# Patient Record
Sex: Female | Born: 1990 | Race: White | Hispanic: No | Marital: Married | Smoking: Never smoker
Health system: Southern US, Community
[De-identification: ages and names within clinical notes are randomized; demographics above are authoritative.]

## PROBLEM LIST (undated history)

## (undated) DIAGNOSIS — M797 Fibromyalgia: Secondary | ICD-10-CM

## (undated) DIAGNOSIS — T7840XA Allergy, unspecified, initial encounter: Secondary | ICD-10-CM

## (undated) DIAGNOSIS — E538 Deficiency of other specified B group vitamins: Secondary | ICD-10-CM

## (undated) DIAGNOSIS — E079 Disorder of thyroid, unspecified: Secondary | ICD-10-CM

## (undated) HISTORY — DX: Disorder of thyroid, unspecified: E07.9

## (undated) HISTORY — PX: LAPAROSCOPIC GASTRIC SLEEVE RESECTION: SHX5895

## (undated) HISTORY — DX: Allergy, unspecified, initial encounter: T78.40XA

## (undated) HISTORY — PX: OTHER SURGICAL HISTORY: SHX169

## (undated) HISTORY — DX: Deficiency of other specified B group vitamins: E53.8

## (undated) HISTORY — DX: Fibromyalgia: M79.7

---

## 2010-11-09 DIAGNOSIS — R5381 Other malaise: Secondary | ICD-10-CM | POA: Insufficient documentation

## 2013-01-10 DIAGNOSIS — E039 Hypothyroidism, unspecified: Secondary | ICD-10-CM | POA: Insufficient documentation

## 2013-01-10 DIAGNOSIS — E538 Deficiency of other specified B group vitamins: Secondary | ICD-10-CM | POA: Insufficient documentation

## 2013-01-10 DIAGNOSIS — F329 Major depressive disorder, single episode, unspecified: Secondary | ICD-10-CM | POA: Insufficient documentation

## 2013-03-22 DIAGNOSIS — E785 Hyperlipidemia, unspecified: Secondary | ICD-10-CM | POA: Insufficient documentation

## 2013-06-21 DIAGNOSIS — M797 Fibromyalgia: Secondary | ICD-10-CM | POA: Insufficient documentation

## 2015-07-22 DIAGNOSIS — Z9884 Bariatric surgery status: Secondary | ICD-10-CM | POA: Insufficient documentation

## 2016-05-19 DIAGNOSIS — K8020 Calculus of gallbladder without cholecystitis without obstruction: Secondary | ICD-10-CM | POA: Insufficient documentation

## 2016-08-23 DIAGNOSIS — Z862 Personal history of diseases of the blood and blood-forming organs and certain disorders involving the immune mechanism: Secondary | ICD-10-CM | POA: Insufficient documentation

## 2018-01-31 ENCOUNTER — Ambulatory Visit
Admission: RE | Admit: 2018-01-31 | Discharge: 2018-01-31 | Disposition: A | Discharge status: Home / Self Care | Payer: BLUE CROSS/BLUE SHIELD | Source: Ambulatory Visit | Attending: Nurse Practitioner | Admitting: Nurse Practitioner

## 2018-01-31 ENCOUNTER — Other Ambulatory Visit: Payer: Self-pay

## 2018-01-31 ENCOUNTER — Ambulatory Visit: Payer: BLUE CROSS/BLUE SHIELD | Attending: Nurse Practitioner | Admitting: Nurse Practitioner

## 2018-01-31 ENCOUNTER — Encounter: Payer: Self-pay | Admitting: Nurse Practitioner

## 2018-01-31 VITALS — BP 114/70 | HR 80 | Temp 98.1°F | Resp 16 | Ht 65.0 in | Wt 194.0 lb

## 2018-01-31 DIAGNOSIS — M79605 Pain in left leg: Secondary | ICD-10-CM

## 2018-01-31 DIAGNOSIS — M533 Sacrococcygeal disorders, not elsewhere classified: Secondary | ICD-10-CM | POA: Insufficient documentation

## 2018-01-31 DIAGNOSIS — G8929 Other chronic pain: Secondary | ICD-10-CM | POA: Insufficient documentation

## 2018-01-31 DIAGNOSIS — Z8249 Family history of ischemic heart disease and other diseases of the circulatory system: Secondary | ICD-10-CM | POA: Insufficient documentation

## 2018-01-31 DIAGNOSIS — M25551 Pain in right hip: Secondary | ICD-10-CM | POA: Insufficient documentation

## 2018-01-31 DIAGNOSIS — Z833 Family history of diabetes mellitus: Secondary | ICD-10-CM | POA: Diagnosis not present

## 2018-01-31 DIAGNOSIS — Z79899 Other long term (current) drug therapy: Secondary | ICD-10-CM | POA: Insufficient documentation

## 2018-01-31 DIAGNOSIS — F329 Major depressive disorder, single episode, unspecified: Secondary | ICD-10-CM | POA: Insufficient documentation

## 2018-01-31 DIAGNOSIS — G894 Chronic pain syndrome: Secondary | ICD-10-CM | POA: Insufficient documentation

## 2018-01-31 DIAGNOSIS — E039 Hypothyroidism, unspecified: Secondary | ICD-10-CM | POA: Diagnosis not present

## 2018-01-31 DIAGNOSIS — Z79891 Long term (current) use of opiate analgesic: Secondary | ICD-10-CM | POA: Diagnosis not present

## 2018-01-31 DIAGNOSIS — Z886 Allergy status to analgesic agent status: Secondary | ICD-10-CM | POA: Diagnosis not present

## 2018-01-31 DIAGNOSIS — M542 Cervicalgia: Secondary | ICD-10-CM | POA: Diagnosis present

## 2018-01-31 DIAGNOSIS — M79604 Pain in right leg: Secondary | ICD-10-CM | POA: Insufficient documentation

## 2018-01-31 DIAGNOSIS — E538 Deficiency of other specified B group vitamins: Secondary | ICD-10-CM | POA: Insufficient documentation

## 2018-01-31 DIAGNOSIS — M899 Disorder of bone, unspecified: Secondary | ICD-10-CM | POA: Insufficient documentation

## 2018-01-31 DIAGNOSIS — M25552 Pain in left hip: Secondary | ICD-10-CM | POA: Diagnosis not present

## 2018-01-31 DIAGNOSIS — Z881 Allergy status to other antibiotic agents status: Secondary | ICD-10-CM | POA: Insufficient documentation

## 2018-01-31 DIAGNOSIS — Z6832 Body mass index (BMI) 32.0-32.9, adult: Secondary | ICD-10-CM | POA: Diagnosis not present

## 2018-01-31 DIAGNOSIS — Z789 Other specified health status: Secondary | ICD-10-CM | POA: Insufficient documentation

## 2018-01-31 DIAGNOSIS — M2550 Pain in unspecified joint: Secondary | ICD-10-CM | POA: Insufficient documentation

## 2018-01-31 DIAGNOSIS — R109 Unspecified abdominal pain: Secondary | ICD-10-CM | POA: Insufficient documentation

## 2018-01-31 DIAGNOSIS — E785 Hyperlipidemia, unspecified: Secondary | ICD-10-CM | POA: Diagnosis not present

## 2018-01-31 DIAGNOSIS — Z79890 Hormone replacement therapy (postmenopausal): Secondary | ICD-10-CM | POA: Diagnosis not present

## 2018-01-31 NOTE — Progress Notes (Signed)
Safety precautions to be maintained throughout the outpatient stay will include: orient to surroundings, keep bed in low position, maintain call bell within reach at all times, provide assistance with transfer out of bed and ambulation.  

## 2018-01-31 NOTE — Progress Notes (Signed)
Patient's Name: Holly Lopez  MRN: [id number]  Referring Provider: Orlena Sheldon, MD  DOB: [birth year]  PCP: Orlena Sheldon  DOS: 01/31/2018  Note by: Dionisio David NP  Service setting: Ambulatory outpatient  Specialty: Interventional Pain Management  Location: [facility] (AMB) Pain Management Facility    Patient type: New Patient    Primary Reason(s) for Visit: Initial Patient Evaluation CC: Hip Pain (bilateral) and Neck Pain (does not radiate, right worse)  HPI  Ms. Openshaw is a 27 y.o. year old, female patient, who comes today for an initial evaluation. She has Abdominal pain; B12 deficiency; Depression; Primary fibromyalgia syndrome; History of pernicious anemia; Hyperlipidemia; Hypothyroidism; Joint pain; Malaise and fatigue; Morbid obesity ([place]); S/P laparoscopic sleeve gastrectomy; Symptomatic cholelithiasis; Chronic pain of both hips; Chronic sacroiliac joint pain; Chronic pain of both lower extremities; Chronic neck pain; Chronic pain syndrome; Long term current use of opiate analgesic; Pharmacologic therapy; Disorder of skeletal system; and Problems influencing health status on their problem list.. Her primarily concern today is the Hip Pain (bilateral) and Neck Pain (does not radiate, right worse)  Pain Assessment: Location: Right, Left Hip Radiating: radiates Onset: More than a month ago Quality: Aching, Sharp, Discomfort, Spasm(deep) Severity: 6 /10 (subjective, self-reported pain score)  Note: Reported level is compatible with observation. Clinically the patient looks like a 0/10       Information on the proper use of the pain scale provided to the patient today. When using our objective Pain Scale, levels between 6 and 10/10 are said to belong in an emergency room, as it progressively worsens from a 6/10, described as severely limiting, requiring emergency care not usually available at an outpatient pain management facility. At a 6/10 level, communication becomes difficult and requires  great effort. Assistance to reach the emergency department may be required. Facial flushing and profuse sweating along with potentially dangerous increases in heart rate and blood pressure will be evident. 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, cooking, exercising Timing: Constant Modifying factors: heat, strecthing, sitting in the seat of the car BP: 114/70  HR: 80  Onset and Duration: Date of onset: since puberty Cause of pain: Unknown Severity: At its worse:7/10, At its best: 2/10, Most of the time: 4/10 Timing: Night, During activity or exercise and After activity or exercise Aggravating Factors: Lifiting, Motion, Prolonged sitting, Prolonged standing, Walking, Walking uphill and Walking downhill Alleviating Factors: Stretching, Hot packs, Medications and TENS Associated Problems: Depression, Fatigue, Inability to concentrate, Suicidal ideations and Pain that does not allow patient to sleep Quality of Pain: Aching, Deep, Disabling, Exhausting and Sharp Previous Examinations or Tests: X-rays and Chiropractic evaluation Previous Treatments: Chiropractic manipulations, Epidural steroid injections, Physical Therapy and TENS  The patient comes into the clinics today for the first time for a chronic pain management evaluation.  Cording to the patient her primary area of pain is in her "sacroiliac joints" and hips.  Denies any precipitating factors.  She admits that the pain has been gradual over time.  She rates the pain is been equal.  She denies any previous surgery.  She admits that she has had steroid injections to her hips/SI joints.  She admits that it was effective and she finally felt "capable."  She admits that she does do home exercises/go to the gym 3 days/week.  She is unsure if this is helpful because it causes her neck spasms.  Her second area of pain is in her legs.  She  admits that this is rarely to occasionally.  She admits that the pain when  it occurs is in the fronts of her legs goes down into the knees to the top of her ankles.  She admits that the pain affects her whole ankle.  She does keep her ankles wrapped for arch support which seems to help her.   She denies any numbness, tingling or weakness. She admits that walking and standing makes the pain worse.  Her third area of pain is in her neck.  She admits that the right side is greater than the left.  She describes it more as neck spasms.  She denies any previous treatment for her neck other than chiropractor TENS unit heat ice and CBD cream.  She has had x-rays.  Today I took the time to provide the patient with information regarding this pain practice. The patient was informed that the practice is divided into two sections: an interventional pain management section, as well as a completely separate and distinct medication management section. I explained that there are procedure days for interventional therapies, and evaluation days for follow-ups and medication management. Because of the amount of documentation required during both, they are kept separated. This means that there is the possibility that she may be scheduled for a procedure on one day, and medication management the next. I have also informed her that because of staffing and facility limitations, this practice will no longer take patients for medication management only. To illustrate the reasons for this, I gave the patient the example of surgeons, and how inappropriate it would be to refer a patient to his/her care, just to write for the post-surgical antibiotics on a surgery done by a different surgeon.   Because interventional pain management is part of the board-certified specialty for the doctors, the patient was informed that joining this practice means that they are open to any and all interventional therapies. I made it clear that this does not mean that they will be forced to have any procedures done. What this means  is that I believe interventional therapies to be essential part of the diagnosis and proper management of chronic pain conditions. Therefore, patients not interested in these interventional alternatives will be better served under the care of a different practitioner.  The patient was also made aware of my Comprehensive Pain Management Safety Guidelines where by joining this practice, they limit all of their nerve blocks and joint injections to those done by our practice, for as long as we are retained to manage their care. Historic Controlled Substance Pharmacotherapy Review  PMP and historical list of controlled substances: Tramadol 50 mg, oxycodone/acetaminophen 5/325 mg, oxycodone 5 mg Highest opioid analgesic regimen found: Oxycodone 5 mg (questionable dosing) 16 tablets given for 1 day (fill date 07/07/2016)oxycodone 90 mg/day Most recent opioid analgesic: Tramadol 50 mg 1 tablet 3 times daily (last fill date 12/27/2017) tramadol 150 mg/day  Current opioid analgesics:  Tramadol 50 mg 1 tablet 3 times daily (last fill date 12/27/2017) tramadol 150 mg/day Highest recorded MME/day: 120 mg/day MME/day: 15 mg/day Medications: The patient did not bring the medication(s) to the appointment, as requested in our "New Patient Package" Pharmacodynamics: Desired effects: Analgesia: The patient reports >50% benefit. Reported improvement in function: The patient reports medication allows her to accomplish basic ADLs. Clinically meaningful improvement in function (CMIF): Sustained CMIF goals met Perceived effectiveness: Described as relatively effective, allowing for increase in activities of daily living (ADL) Undesirable effects: Side-effects or Adverse reactions:  None reported Historical Monitoring: The patient  has no drug history on file. List of all UDS Test(s): No results found for: MDMA, COCAINSCRNUR, PCPSCRNUR, PCPQUANT, CANNABQUANT, THCU, [place] List of all Serum Drug Screening Test(s):  No  results found for: AMPHSCRSER, BARBSCRSER, BENZOSCRSER, COCAINSCRSER, PCPSCRSER, PCPQUANT, THCSCRSER, CANNABQUANT, OPIATESCRSER, OXYSCRSER, PROPOXSCRSER Historical Background Evaluation: [place] PDMP: Six (6) year initial data search conducted.             [place] Department of public safety, offender search: Editor, commissioning Information) Non-contributory Risk Assessment Profile: Aberrant behavior: None observed or detected today Risk factors for fatal opioid overdose: caucasian Fatal overdose hazard ratio (HR): Calculation deferred Non-fatal overdose hazard ratio (HR): Calculation deferred Risk of opioid abuse or dependence: 0.7-3.0% with doses ? 36 MME/day and 6.1-26% with doses ? 120 MME/day. Substance use disorder (SUD) risk level: Pending results of Medical Psychology Evaluation for SUD Opioid risk tool (ORT) (Total Score):    ORT Scoring interpretation table:  Score <3 = Low Risk for SUD  Score between 4-7 = Moderate Risk for SUD  Score >8 = High Risk for Opioid Abuse   PHQ-2 Depression Scale:  Total score: 3  PHQ-2 Scoring interpretation table: (Score and probability of major depressive disorder)  Score 0 = No depression  Score 1 = 15.4% Probability  Score 2 = 21.1% Probability  Score 3 = 38.4% Probability  Score 4 = 45.5% Probability  Score 5 = 56.4% Probability  Score 6 = 78.6% Probability   PHQ-9 Depression Scale:  Total score: 14  PHQ-9 Scoring interpretation table:  Score 0-4 = No depression  Score 5-9 = Mild depression  Score 10-14 = Moderate depression  Score 15-19 = Moderately severe depression  Score 20-27 = Severe depression (2.4 times higher risk of SUD and 2.89 times higher risk of overuse)   Pharmacologic Plan: Pending ordered tests and/or consults  Meds  The patient has a current medication list which includes the following prescription(s): cholecalciferol, cyanocobalamin, cyclobenzaprine, doxylamine (sleep), folic acid-pyridoxine-cyancobalamin, levonorgestrel-ethinyl  estradiol, levothyroxine, melatonin, multivitamin, tramadol, and UNABLE TO FIND.  Current Outpatient Medications on File Prior to Visit  Medication Sig  . cholecalciferol (VITAMIN D) 1000 units tablet Take 1,000 Units by mouth daily.  . Cyanocobalamin (B-12 COMPLIANCE INJECTION) 1000 MCG/ML KIT Inject as directed.  . cyclobenzaprine (FLEXERIL) 5 MG tablet Take by mouth.  . doxylamine, Sleep, (UNISOM) 25 MG tablet Take 25 mg by mouth at bedtime as needed.  . folic acid-pyridoxine-cyancobalamin (FOLTX) 2.5-25-2 MG TABS tablet Take 1 tablet by mouth daily.  Marland Kitchen levonorgestrel-ethinyl estradiol (ENPRESSE,TRIVORA) tablet Take 1 tablet by mouth daily.  Marland Kitchen levothyroxine (SYNTHROID, LEVOTHROID) 25 MCG tablet levothyroxine 25 mcg tablet  . Melatonin 1 MG CAPS Take by mouth.  . Multiple Vitamin (MULTIVITAMIN) capsule Take 1 capsule by mouth daily.  . traMADol (ULTRAM) 50 MG tablet Take by mouth every 6 (six) hours as needed.  Marland Kitchen UNABLE TO FIND Take 5 g by mouth as needed.   No current facility-administered medications on file prior to visit.    Imaging Review  Note: No new results found.        ROS  Cardiovascular History: No reported cardiovascular signs or symptoms such as High blood pressure, coronary artery disease, abnormal heart rate or rhythm, heart attack, blood thinner therapy or heart weakness and/or failure Pulmonary or Respiratory History: No reported pulmonary signs or symptoms such as wheezing and difficulty taking a deep full breath (Asthma), difficulty blowing air out (Emphysema), coughing up mucus (Bronchitis), persistent dry  cough, or temporary stoppage of breathing during sleep Neurological History: No reported neurological signs or symptoms such as seizures, abnormal skin sensations, urinary and/or fecal incontinence, being born with an abnormal open spine and/or a tethered spinal cord Review of Past Neurological Studies: No results found for this or any previous  visit. Psychological-Psychiatric History: Depressed and Prone to panicking Gastrointestinal History: No reported gastrointestinal signs or symptoms such as vomiting or evacuating blood, reflux, heartburn, alternating episodes of diarrhea and constipation, inflamed or scarred liver, or pancreas or irrregular and/or infrequent bowel movements Genitourinary History: No reported renal or genitourinary signs or symptoms such as difficulty voiding or producing urine, peeing blood, non-functioning kidney, kidney stones, difficulty emptying the bladder, difficulty controlling the flow of urine, or chronic kidney disease Hematological History: Weakness due to low blood hemoglobin or red blood cell count (Anemia) Endocrine History: Slow thyroid Rheumatologic History: Generalized muscle aches (Fibromyalgia) Musculoskeletal History: Negative for myasthenia gravis, muscular dystrophy, multiple sclerosis or malignant hyperthermia Work History: Working part time  Allergies  Ms. Follette is allergic to nsaids and doxycycline.  Laboratory Chemistry  Inflammation Markers No results found for: CRP, ESRSEDRATE (CRP: Acute Phase) (ESR: Chronic Phase) Renal Function Markers No results found for: BUN, CREATININE, GFRAA, GFRNONAA Hepatic Function Markers No results found for: AST, ALT, ALBUMIN, ALKPHOS, HCVAB Electrolytes No results found for: NA, K, CL, CALCIUM, MG Neuropathy Markers No results found for: [id number] Bone Pathology Markers No results found for: Hendricks Milo, VD125OH2TOT, [id number], [id number], 25OHVITD1, 25OHVITD2, 25OHVITD3, CALCIUM, TESTOFREE, TESTOSTERONE Coagulation Parameters No results found for: INR, LABPROT, APTT, PLT Cardiovascular Markers No results found for: BNP, HGB, HCT Note: Lab results reviewed.  PFSH  Drug: Ms. Kugelman  has no drug history on file. Alcohol:  has no alcohol history on file. Tobacco:  has no tobacco history on file. Medical:  has a past medical history of  Allergy, Fibromyalgia, Thyroid disease, and Vitamin B12 deficiency (non anemic). Family: family history includes Diabetes in her brother; Heart disease in her brother; Hypertension in her brother and father; Thyroid disease in her mother.   Active Ambulatory Problems    Diagnosis Date Noted  . Abdominal pain 01/31/2018  . B12 deficiency 01/10/2013  . Depression 01/10/2013  . Primary fibromyalgia syndrome 06/21/2013  . History of pernicious anemia 08/23/2016  . Hyperlipidemia 03/22/2013  . Hypothyroidism 01/10/2013  . Joint pain 01/31/2018  . Malaise and fatigue 11/09/2010  . Morbid obesity ([place]) 01/31/2018  . S/P laparoscopic sleeve gastrectomy 07/22/2015  . Symptomatic cholelithiasis 05/19/2016  . Chronic pain of both hips 01/31/2018  . Chronic sacroiliac joint pain 01/31/2018  . Chronic pain of both lower extremities 01/31/2018  . Chronic neck pain 01/31/2018  . Chronic pain syndrome 01/31/2018  . Long term current use of opiate analgesic 01/31/2018  . Pharmacologic therapy 01/31/2018  . Disorder of skeletal system 01/31/2018  . Problems influencing health status 01/31/2018   Resolved Ambulatory Problems    Diagnosis Date Noted  . No Resolved Ambulatory Problems   Past Medical History:  Diagnosis Date  . Allergy   . Fibromyalgia   . Thyroid disease   . Vitamin B12 deficiency (non anemic)    Constitutional Exam  General appearance: Well nourished, well developed, and well hydrated. In no apparent acute distress Vitals:   01/31/18 0952  BP: 114/70  Pulse: 80  Resp: 16  Temp: 98.1 F (36.7 C)  SpO2: 100%  Weight: 194 lb (88 kg)  Height: 5' 5" (1.651 m)   BMI Assessment: Estimated  body mass index is 32.28 kg/m as calculated from the following:   Height as of this encounter: 5' 5" (1.651 m).   Weight as of this encounter: 194 lb (88 kg).  BMI interpretation table: BMI level Category Range association with higher incidence of chronic pain  <18 kg/m2 Underweight    18.5-24.9 kg/m2 Ideal body weight   25-29.9 kg/m2 Overweight Increased incidence by 20%  30-34.9 kg/m2 Obese (Class I) Increased incidence by 68%  35-39.9 kg/m2 Severe obesity (Class II) Increased incidence by 136%  >40 kg/m2 Extreme obesity (Class III) Increased incidence by 254%   BMI Readings from Last 4 Encounters:  01/31/18 32.28 kg/m   Wt Readings from Last 4 Encounters:  01/31/18 194 lb (88 kg)  Psych/Mental status: Alert, oriented x 3 (person, place, & time)       Eyes: PERLA Respiratory: No evidence of acute respiratory distress  Cervical Spine Exam  Inspection: No masses, redness, or swelling Alignment: Symmetrical Functional ROM: Unrestricted ROM      Stability: No instability detected Muscle strength & Tone: Functionally intact Sensory: Unimpaired Palpation: Complains of area being tender to palpation              Upper Extremity (UE) Exam    Side: Right upper extremity  Side: Left upper extremity  Inspection: No masses, redness, swelling, or asymmetry. No contractures  Inspection: No masses, redness, swelling, or asymmetry. No contractures  Functional ROM: Unrestricted ROM          Functional ROM: Unrestricted ROM          Muscle strength & Tone: Functionally intact  Muscle strength & Tone: Functionally intact  Sensory: Unimpaired  Sensory: Unimpaired  Palpation: No palpable anomalies              Palpation: No palpable anomalies              Specialized Test(s): Deferred         Specialized Test(s): Deferred          Thoracic Spine Exam  Inspection: No masses, redness, or swelling Alignment: Symmetrical Functional ROM: Unrestricted ROM Stability: No instability detected Sensory: Unimpaired Muscle strength & Tone: No palpable anomalies  Lumbar Spine Exam  Inspection: No masses, redness, or swelling Alignment: Symmetrical Functional ROM: Unrestricted ROM      Stability: No instability detected Muscle strength & Tone: Functionally intact Sensory:  Unimpaired Palpation: Complains of area being tender to palpation       Provocative Tests: Lumbar Hyperextension and rotation test: Negative       Patrick's Maneuver: Negative                    Gait & Posture Assessment  Ambulation: Unassisted Gait: Relatively normal for age and body habitus Posture: WNL   Lower Extremity Exam    Side: Right lower extremity  Side: Left lower extremity  Inspection: No masses, redness, swelling, or asymmetry. No contractures ankle and arch wraps  Inspection: No masses, redness, swelling, or asymmetry. No contractures ankle and arch wraps  Functional ROM: Full ROM for all joints of the lower extremity  Functional ROM: Full ROM for all joints of the lower extremity  Muscle strength & Tone: Able to Toe-walk & Heel-walk without problems  Muscle strength & Tone: Able to Toe-walk & Heel-walk without problems  Sensory: Unimpaired  Sensory: Unimpaired  Palpation: No palpable anomalies  Palpation: No palpable anomalies   Assessment  Primary Diagnosis & Pertinent Problem List: The primary  encounter diagnosis was Chronic pain of both hips. Diagnoses of Chronic sacroiliac joint pain, Chronic pain of both lower extremities, Chronic neck pain, Chronic pain syndrome, Long term current use of opiate analgesic, Pharmacologic therapy, Disorder of skeletal system, and Problems influencing health status were also pertinent to this visit.  Visit Diagnosis: 1. Chronic pain of both hips   2. Chronic sacroiliac joint pain   3. Chronic pain of both lower extremities   4. Chronic neck pain   5. Chronic pain syndrome   6. Long term current use of opiate analgesic   7. Pharmacologic therapy   8. Disorder of skeletal system   9. Problems influencing health status    Plan of Care  Initial treatment plan:  Please be advised that as per protocol, today's visit has been an evaluation only. We have not taken over the patient's controlled substance management.  Problem-specific  plan: No problem-specific Assessment & Plan notes found for this encounter.  Ordered Lab-work, Procedure(s), Referral(s), & Consult(s): Orders Placed This Encounter  Procedures  . DG Si Joints  . DG HIP UNILAT W OR W/O PELVIS 2-3 VIEWS LEFT  . DG HIP UNILAT W OR W/O PELVIS 2-3 VIEWS RIGHT  . DG Lumbar Spine Complete W/Bend  . Compliance Drug Analysis, Ur  . Comp. Metabolic Panel (12)  . Magnesium  . Vitamin B12  . Sedimentation rate  . C-reactive protein   Pharmacotherapy: Medications ordered:  No orders of the defined types were placed in this encounter.  Medications administered during this visit: Jolisa Daft had no medications administered during this visit.   Pharmacotherapy under consideration:  Opioid Analgesics: The patient was informed that there is no guarantee that she would be a candidate for opioid analgesics. The decision will be made following CDC guidelines. This decision will be based on the results of diagnostic studies, as well as Ms. Zoll's risk profile.  She is not interested in any opioid medications Membrane stabilizer: To be determined at a later time Muscle relaxant: To be determined at a later time NSAID: To be determined at a later time Other analgesic(s): To be determined at a later time   Interventional therapies under consideration: Ms. Pearlman was informed that there is no guarantee that she would be a candidate for interventional therapies. The decision will be based on the results of diagnostic studies, as well as Ms. Thorman's risk profile.  Possible procedure(s): Diagnostic trigger point injections Diagnostic bilateral sacroiliac joint injections   Provider-requested follow-up: Return for 2nd Visit, w/ Dr. Dossie Arbour.  Future Appointments  Date Time Provider [place]  03/01/2018 10:30 AM Milinda Pointer, MD [facility] None    Primary Care Physician: Orlena Sheldon Location: [facility] Note by:  Date:  01/31/2018; Time: 3:47 PM  Pain Score Disclaimer: We use the NRS-11 scale. This is a self-reported, subjective measurement of pain severity with only modest accuracy. It is used primarily to identify changes within a particular patient. It must be understood that outpatient pain scales are significantly less accurate that those used for research, where they can be applied under ideal controlled circumstances with minimal exposure to variables. In reality, the score is likely to be a combination of pain intensity and pain affect, where pain affect describes the degree of emotional arousal or changes in action readiness caused by the sensory experience of pain. Factors such as social and work situation, setting, emotional state, anxiety levels, expectation, and prior pain experience may influence pain perception and show  large inter-individual differences that may also be affected by time variables.  Patient instructions provided during this appointment: Patient Instructions   ____________________________________________________________________________________________  Appointment Policy Summary  It is our goal and responsibility to provide the medical community with assistance in the evaluation and management of patients with chronic pain. Unfortunately our resources are limited. Because we do not have an unlimited amount of time, or available appointments, we are required to closely monitor and manage their use. The following rules exist to maximize their use:  Patient's responsibilities: 1. Punctuality:  At what time should I arrive? You should be physically present in our office 30 minutes before your scheduled appointment. Your scheduled appointment is with your assigned healthcare provider. However, it takes 5-10 minutes to be "checked-in", and another 15 minutes for the nurses to do the admission. If you arrive to our office at the time you were given for your appointment, you will end up being  at least 20-25 minutes late to your appointment with the provider. 2. Tardiness:  What happens if I arrive only a few minutes after my scheduled appointment time? You will need to reschedule your appointment. The cutoff is your appointment time. This is why it is so important that you arrive at least 30 minutes before that appointment. If you have an appointment scheduled for 10:00 AM and you arrive at 10:01, you will be required to reschedule your appointment.  3. Plan ahead:  Always assume that you will encounter traffic on your way in. Plan for it. If you are dependent on a driver, make sure they understand these rules and the need to arrive early. 4. Other appointments and responsibilities:  Avoid scheduling any other appointments before or after your pain clinic appointments.  5. Be prepared:  Write down everything that you need to discuss with your healthcare provider and give this information to the admitting nurse. Write down the medications that you will need refilled. Bring your pills and bottles (even the empty ones), to all of your appointments, except for those where a procedure is scheduled. 6. No children or pets:  Find someone to take care of them. It is not appropriate to bring them in. 7. Scheduling changes:  We request "advanced notification" of any changes or cancellations. 8. Advanced notification:  Defined as a time period of more than 24 hours prior to the originally scheduled appointment. This allows for the appointment to be offered to other patients. 9. Rescheduling:  When a visit is rescheduled, it will require the cancellation of the original appointment. For this reason they both fall within the category of "Cancellations".  10. Cancellations:  They require advanced notification. Any cancellation less than 24 hours before the  appointment will be recorded as a "No Show". 11. No Show:  Defined as an unkept appointment where the patient failed to notify or declare to  the practice their intention or inability to keep the appointment.  Corrective process for repeat offenders:  1. Tardiness: Three (3) episodes of rescheduling due to late arrivals will be recorded as one (1) "No Show". 2. Cancellation or reschedule: Three (3) cancellations or rescheduling will be recorded as one (1) "No Show". 3. "No Shows": Three (3) "No Shows" within a 12 month period will result in discharge from the practice. ____________________________________________________________________________________________  ____________________________________________________________________________________________  Pain Scale  Introduction: The pain score used by this practice is the Verbal Numerical Rating Scale (VNRS-11). This is an 11-point scale. It is for adults and children 10 years or older.  There are significant differences in how the pain score is reported, used, and applied. Forget everything you learned in the past and learn this scoring system.  General Information: The scale should reflect your current level of pain. Unless you are specifically asked for the level of your worst pain, or your average pain. If you are asked for one of these two, then it should be understood that it is over the past 24 hours.  Basic Activities of Daily Living (ADL): Personal hygiene, dressing, eating, transferring, and using restroom.  Instructions: Most patients tend to report their level of pain as a combination of two factors, their physical pain and their psychosocial pain. This last one is also known as "suffering" and it is reflection of how physical pain affects you socially and psychologically. From now on, report them separately. From this point on, when asked to report your pain level, report only your physical pain. Use the following table for reference.  Pain Clinic Pain Levels (0-5/10)  Pain Level Score  Description  No Pain 0   Mild pain 1 Nagging, annoying, but does not interfere with  basic activities of daily living (ADL). Patients are able to eat, bathe, get dressed, toileting (being able to get on and off the toilet and perform personal hygiene functions), transfer (move in and out of bed or a chair without assistance), and maintain continence (able to control bladder and bowel functions). Blood pressure and heart rate are unaffected. A normal heart rate for a healthy adult ranges from 60 to 100 bpm (beats per minute).   Mild to moderate pain 2 Noticeable and distracting. Impossible to hide from other people. More frequent flare-ups. Still possible to adapt and function close to normal. It can be very annoying and may have occasional stronger flare-ups. With discipline, patients may get used to it and adapt.   Moderate pain 3 Interferes significantly with activities of daily living (ADL). It becomes difficult to feed, bathe, get dressed, get on and off the toilet or to perform personal hygiene functions. Difficult to get in and out of bed or a chair without assistance. Very distracting. With effort, it can be ignored when deeply involved in activities.   Moderately severe pain 4 Impossible to ignore for more than a few minutes. With effort, patients may still be able to manage work or participate in some social activities. Very difficult to concentrate. Signs of autonomic nervous system discharge are evident: dilated pupils (mydriasis); mild sweating (diaphoresis); sleep interference. Heart rate becomes elevated (>115 bpm). Diastolic blood pressure (lower number) rises above 100 mmHg. Patients find relief in laying down and not moving.   Severe pain 5 Intense and extremely unpleasant. Associated with frowning face and frequent crying. Pain overwhelms the senses.  Ability to do any activity or maintain social relationships becomes significantly limited. Conversation becomes difficult. Pacing back and forth is common, as getting into a comfortable position is nearly impossible. Pain  wakes you up from deep sleep. Physical signs will be obvious: pupillary dilation; increased sweating; goosebumps; brisk reflexes; cold, clammy hands and feet; nausea, vomiting or dry heaves; loss of appetite; significant sleep disturbance with inability to fall asleep or to remain asleep. When persistent, significant weight loss is observed due to the complete loss of appetite and sleep deprivation.  Blood pressure and heart rate becomes significantly elevated. Caution: If elevated blood pressure triggers a pounding headache associated with blurred vision, then the patient should immediately seek attention at an urgent or emergency care unit,  as these may be signs of an impending stroke.    Emergency Department Pain Levels (6-10/10)  Emergency Room Pain 6 Severely limiting. Requires emergency care and should not be seen or managed at an outpatient pain management facility. Communication becomes difficult and requires great effort. Assistance to reach the emergency department may be required. Facial flushing and profuse sweating along with potentially dangerous increases in heart rate and blood pressure will be evident.   Distressing pain 7 Self-care is very difficult. Assistance is required to transport, or use restroom. Assistance to reach the emergency department will be required. Tasks requiring coordination, such as bathing and getting dressed become very difficult.   Disabling pain 8 Self-care is no longer possible. At this level, pain is disabling. The individual is unable to do even the most "basic" activities such as walking, eating, bathing, dressing, transferring to a bed, or toileting. Fine motor skills are lost. It is difficult to think clearly.   Incapacitating pain 9 Pain becomes incapacitating. Thought processing is no longer possible. Difficult to remember your own name. Control of movement and coordination are lost.   The worst pain imaginable 10 At this level, most patients pass out  from pain. When this level is reached, collapse of the autonomic nervous system occurs, leading to a sudden drop in blood pressure and heart rate. This in turn results in a temporary and dramatic drop in blood flow to the brain, leading to a loss of consciousness. Fainting is one of the body's self defense mechanisms. Passing out puts the brain in a calmed state and causes it to shut down for a while, in order to begin the healing process.    Summary: 1. Refer to this scale when providing [place] with your pain level. 2. Be accurate and careful when reporting your pain level. This will help with your care. 3. Over-reporting your pain level will lead to loss of credibility. 4. Even a level of 1/10 means that there is pain and will be treated at our facility. 5. High, inaccurate reporting will be documented as "Symptom Exaggeration", leading to loss of credibility and suspicions of possible secondary gains such as obtaining more narcotics, or wanting to appear disabled, for fraudulent reasons. 6. Only pain levels of 5 or below will be seen at our facility. 7. Pain levels of 6 and above will be sent to the Emergency Department and the appointment cancelled. ____________________________________________________________________________________________

## 2018-01-31 NOTE — Patient Instructions (Signed)

## 2018-02-01 LAB — COMP. METABOLIC PANEL (12)
AST: 17 IU/L (ref 0–40)
Albumin/Globulin Ratio: 1.8 (ref 1.2–2.2)
Albumin: 4.4 g/dL (ref 3.5–5.5)
Alkaline Phosphatase: 50 IU/L (ref 39–117)
BUN/Creatinine Ratio: 12 (ref 9–23)
BUN: 11 mg/dL (ref 6–20)
Bilirubin Total: 0.3 mg/dL (ref 0.0–1.2)
CREATININE: 0.95 mg/dL (ref 0.57–1.00)
Calcium: 9.2 mg/dL (ref 8.7–10.2)
Chloride: 104 mmol/L (ref 96–106)
GFR calc Af Amer: 94 mL/min/{1.73_m2} (ref >59)
GFR calc non Af Amer: 81 mL/min/{1.73_m2} (ref >59)
Globulin, Total: 2.5 g/dL (ref 1.5–4.5)
Glucose: 87 mg/dL (ref 65–99)
Potassium: 4.2 mmol/L (ref 3.5–5.2)
Sodium: 141 mmol/L (ref 134–144)
TOTAL PROTEIN: 6.9 g/dL (ref 6.0–8.5)

## 2018-02-01 LAB — VITAMIN B12: Vitamin B-12: 917 pg/mL (ref 232–1245)

## 2018-02-01 LAB — MAGNESIUM: Magnesium: 2.1 mg/dL (ref 1.6–2.3)

## 2018-02-01 LAB — C-REACTIVE PROTEIN: CRP: 9 mg/L (ref 0–10)

## 2018-02-01 LAB — SEDIMENTATION RATE: SED RATE: 6 mm/h (ref 0–32)

## 2018-02-03 LAB — COMPLIANCE DRUG ANALYSIS, UR

## 2018-02-28 DIAGNOSIS — M542 Cervicalgia: Secondary | ICD-10-CM | POA: Insufficient documentation

## 2018-02-28 DIAGNOSIS — M7918 Myalgia, other site: Secondary | ICD-10-CM

## 2018-02-28 DIAGNOSIS — M47812 Spondylosis without myelopathy or radiculopathy, cervical region: Secondary | ICD-10-CM | POA: Insufficient documentation

## 2018-02-28 DIAGNOSIS — M545 Low back pain: Secondary | ICD-10-CM

## 2018-02-28 DIAGNOSIS — M797 Fibromyalgia: Secondary | ICD-10-CM | POA: Insufficient documentation

## 2018-02-28 DIAGNOSIS — G8929 Other chronic pain: Secondary | ICD-10-CM | POA: Insufficient documentation

## 2018-02-28 NOTE — Progress Notes (Signed)
Patient's Name: Holly Lopez  MRN: [id number]  Referring Provider: Orlena Sheldon, MD  DOB: 06-28-1991  PCP: Orlena Sheldon  DOS: 03/01/2018  Note by: Gaspar Cola, MD  Service setting: Ambulatory outpatient  Specialty: Interventional Pain Management  Location: [facility] (AMB) Pain Management Facility    Patient type: Established   Primary Reason(s) for Visit: Encounter for evaluation before starting new chronic pain management plan of care (Level of risk: moderate) CC: Hip Pain  HPI  Holly Lopez is a 27 y.o. year old, female patient, who comes today for a follow-up evaluation to review the test results and decide on a treatment plan. She has Abdominal pain; B12 deficiency; Depression; Primary fibromyalgia syndrome; History of pernicious anemia; Hyperlipidemia; Hypothyroidism; Joint pain; Malaise and fatigue; Morbid obesity ([place]); S/P laparoscopic sleeve gastrectomy; Symptomatic cholelithiasis; Chronic hip pain (Bilateral); Chronic sacroiliac joint pain (Bilateral); Chronic lower extremity pain (Secondary Area of Pain) (Bilateral); Chronic neck pain (Tertiary Area of Pain) (Bilateral) (R>L); Chronic pain syndrome; Long term current use of opiate analgesic; Pharmacologic therapy; Disorder of skeletal system; Problems influencing health status; Fibromyalgia; Chronic musculoskeletal pain; Chronic low back pain (Primary Area of Pain) (Bilateral) w/o sciatica; Cervicalgia; Cervical facet syndrome (Bilateral) (R>L); Lumbar facet joint syndrome (B) (R>L); and Spondylosis without myelopathy or radiculopathy, lumbosacral region on their problem list. Her primarily concern today is the Hip Pain  Pain Assessment: Location: Right, Left Hip(Hip, both wrist and neck) Radiating: Denies Onset: More than a month ago Duration: Chronic pain Quality: Aching, Sharp, Constant, Discomfort Severity: 4 /10 (subjective, self-reported pain score)  Note: Reported level is inconsistent with clinical observations.  Clinically the patient looks like a 2/10 A 2/10 is viewed as "Mild to Moderate" and described as noticeable and distracting. Impossible to hide from other people. More frequent flare-ups. Still possible to adapt and function close to normal. It can be very annoying and may have occasional stronger flare-ups. With discipline, patients may get used to it and adapt. Information on the proper use of the pain scale provided to the patient today. When using our objective Pain Scale, levels between 6 and 10/10 are said to belong in an emergency room, as it progressively worsens from a 6/10, described as severely limiting, requiring emergency care not usually available at an outpatient pain management facility. At a 6/10 level, communication becomes difficult and requires great effort. Assistance to reach the emergency department may be required. Facial flushing and profuse sweating along with potentially dangerous increases in heart rate and blood pressure will be evident. Effect on ADL: prolonged standing, limits daily activities, unable to walk dog,  Timing: Constant Modifying factors: pain medications, heat, strecthing BP: (!) 133/97  HR: 68  Holly Lopez comes in today for a follow-up visit after her initial evaluation on 01/31/2018. Today we went over the results of her tests. These were explained in "Layman's terms". During today's appointment we went over my diagnostic impression, as well as the proposed treatment plan.  According to the patient her primary area of pain is in her "sacroiliac joints" and hips. Denies any precipitating factors. She admits that the pain has been gradual over time. She rates the pain is been equal. She denies any previous surgery. She admits that she has had steroid injections to her hips/SI joints. Used to go to a pain clinic in [place]. She admits that it was effective and she finally felt "capable". She admits that she does do home exercises/go to the gym 3 days/week. PT has not  helped. She is unsure if this is helpful because it causes her neck spasms.  Her second area of pain is in her legs. She admits that this is rarely to occasionally. She admits that the pain when it occurs is in the fronts of her legs goes down into the knees to the top of her ankles. She admits that the pain affects her whole ankle. She does keep her ankles wrapped for arch support which seems to help her. She denies any numbness, tingling or weakness. She admits that walking and standing makes the pain worse.  Her third area of pain is in her neck. She admits that the right side is greater than the left. She describes it more as neck spasms. She denies any previous treatment for her neck other than chiropractor TENS unit heat ice and CBD cream.  She has had x-rays.  In considering the treatment plan options, Ms. Choudhry was reminded that I no longer take patients for medication management only. I asked her to let me know if she had no intention of taking advantage of the interventional therapies, so that we could make arrangements to provide this space to someone interested. I also made it clear that undergoing interventional therapies for the purpose of getting pain medications is very inappropriate on the part of a patient, and it will not be tolerated in this practice. This type of behavior would suggest true addiction and therefore it requires referral to an addiction specialist.   Further details on both, my assessment(s), as well as the proposed treatment plan, please see below.  Controlled Substance Pharmacotherapy Assessment REMS (Risk Evaluation and Mitigation Strategy)  Analgesic: Tramadol 50 mg 1 tablet 3 times daily (last fill date 12/27/2017) tramadol 150 mg/day Highest recorded MME/day: 120 mg/day MME/day: 15 mg/day Pill Count: None expected due to no prior prescriptions written by our practice. No notes on file Pharmacokinetics: Liberation and absorption (onset of action):  WNL Distribution (time to peak effect): WNL Metabolism and excretion (duration of action): WNL         Pharmacodynamics: Desired effects: Analgesia: Ms. Jerome reports >50% benefit. Functional ability: Patient reports that medication allows her to accomplish basic ADLs Clinically meaningful improvement in function (CMIF): Sustained CMIF goals met Perceived effectiveness: Described as relatively effective, allowing for increase in activities of daily living (ADL) Undesirable effects: Side-effects or Adverse reactions: None reported Monitoring: [place] PMP: Online review of the past 12-monthperiod previously conducted. Not applicable at this point since we have not taken over the patient's medication management yet. List of other Serum/Urine Drug Screening Test(s):  No results found for: AMPHSCRSER, BARBSCRSER, BENZOSCRSER, COCAINSCRSER, COCAINSCRNUR, PCPSCRSER, THCSCRSER, THCU, CANNABQUANT, [place] of all UDS test(s) done:  Lab Results  Component Value Date   SUMMARY FINAL 01/31/2018   Last UDS on record: Summary  Date Value Ref Range Status  01/31/2018 FINAL  Final    Comment:    ==================================================================== TOXASSURE COMP DRUG ANALYSIS,UR ==================================================================== Test                             Result       Flag       Units Drug Present and Declared for Prescription Verification   Cyclobenzaprine                PRESENT      EXPECTED   Desmethylcyclobenzaprine       PRESENT  EXPECTED    Desmethylcyclobenzaprine is an expected metabolite of    cyclobenzaprine.   Doxylamine                     PRESENT      EXPECTED Drug Absent but Declared for Prescription Verification   Tramadol                       Not Detected UNEXPECTED ng/mg creat   Duloxetine                     Not Detected  UNEXPECTED ==================================================================== Test                      Result    Flag   Units      Ref Range   Creatinine              176              mg/dL      >=20 ==================================================================== Declared Medications:  The flagging and interpretation on this report are based on the  following declared medications.  Unexpected results may arise from  inaccuracies in the declared medications.  **Note: The testing scope of this panel includes these medications:  Cyclobenzaprine (Flexeril)  Doxylamine (Unisom)  Duloxetine (Cymbalta)  Tramadol (Ultram)  **Note: The testing scope of this panel does not include following  reported medications:  Cyanocobalamin  Drospirenone (Yasmin)  Ethinyl estradiol  Ethinyl Estradiol (Enpresse)  Ethinyl Estradiol (Yasmin)  Levonorgestrel  Levonorgestrel (Enpresse)  Levothyroxine  Melatonin  Multivitamin  Vitamin D ==================================================================== For clinical consultation, please call [phone]. ====================================================================    UDS interpretation: No unexpected findings.          Medication Assessment Form: Patient introduced to form today Treatment compliance: Treatment may start today if patient agrees with proposed plan. Evaluation of compliance is not applicable at this point Risk Assessment Profile: Aberrant behavior: See initial evaluations. None observed or detected today Comorbid factors increasing risk of overdose: See initial evaluation. No additional risks detected today Medical Psychology Evaluation: Please see scanned results in medical record. Opioid risk tool (ORT) (Total Score): 10 Personal History of Substance Abuse (SUD-Substance use disorder):  Alcohol: Negative  Illegal Drugs: Negative  Rx Drugs: Negative  ORT Risk Level calculation: High Risk Risk of substance use  disorder (SUD): Low Opioid Risk Tool - 03/01/18 1021      Family History of Substance Abuse   Alcohol  Positive Female    Illegal Drugs  Positive Female    Rx Drugs  Positive Female or Female      Personal History of Substance Abuse   Alcohol  Negative    Illegal Drugs  Negative    Rx Drugs  Negative      Total Score   Opioid Risk Tool Scoring  10    Opioid Risk Interpretation  High Risk      ORT Scoring interpretation table:  Score <3 = Low Risk for SUD  Score between 4-7 = Moderate Risk for SUD  Score >8 = High Risk for Opioid Abuse   Risk Mitigation Strategies:  Patient opioid safety counseling: Completed today. Counseling provided to patient as per "Patient Counseling Document". Document signed by patient, attesting to counseling and understanding Patient-Prescriber Agreement (PPA): Obtained today.  Controlled substance notification to other providers: Written and sent today.  Pharmacologic Plan: Today we may be taking over the patient's  pharmacological regimen. See below.             Laboratory Chemistry  Inflammation Markers (CRP: Acute Phase) (ESR: Chronic Phase) Lab Results  Component Value Date   CRP 9 01/31/2018   ESRSEDRATE 6 01/31/2018                         Rheumatology Markers No results found.  Renal Function Markers Lab Results  Component Value Date   BUN 11 01/31/2018   CREATININE 0.95 01/31/2018   BCR 12 01/31/2018   GFRAA 94 01/31/2018   GFRNONAA 81 01/31/2018                             Hepatic Function Markers Lab Results  Component Value Date   AST 17 01/31/2018   ALBUMIN 4.4 01/31/2018   ALKPHOS 50 01/31/2018                        Electrolytes Lab Results  Component Value Date   NA 141 01/31/2018   K 4.2 01/31/2018   CL 104 01/31/2018   CALCIUM 9.2 01/31/2018   MG 2.1 01/31/2018                        Neuropathy Markers Lab Results  Component Value Date   VITAMINB12 917 01/31/2018                        Bone Pathology  Markers No results found.  Coagulation Parameters No results found.  Cardiovascular Markers No results found.  CA Markers No results found.  Note: Lab results reviewed.  Recent Diagnostic Imaging Review  Lumbosacral Imaging: Lumbar DG Bending views:  Results for orders placed during the hospital encounter of 01/31/18  DG Lumbar Spine Complete W/Bend   Narrative CLINICAL DATA:  27 year old female with chronic pain both hips and sacroiliac joints. Initial encounter.  EXAM: LUMBAR SPINE - COMPLETE WITH BENDING VIEWS  COMPARISON:  None.  FINDINGS: Normal alignment lumbar spine. No significant disc space narrowing. No abnormal motion between flexion and extension. No compression fracture or pars defect noted. No significant facet degenerative changes. Small bone island right ilium. Sacroiliac joints appear intact. Post surgical changes left upper quadrant.  IMPRESSION: Negative.   Electronically Signed   By: Genia Del M.D.   On: 01/31/2018 12:23    Sacroiliac Joint Imaging: Sacroiliac Joint DG:  Results for orders placed during the hospital encounter of 01/31/18  DG Si Joints   Narrative CLINICAL DATA:  27 year old female with chronic pain both hips and sacroiliac joints. Initial encounter.  EXAM: BILATERAL SACROILIAC JOINTS - 3+ VIEW  COMPARISON:  None.  FINDINGS: Possible small bone island adjacent to the right sacroiliac joint. Sacroiliac joints otherwise intact.  IMPRESSION: Negative.   Electronically Signed   By: Genia Del M.D.   On: 01/31/2018 12:26    Hip Imaging: Hip-R DG 2-3 views:  Results for orders placed during the hospital encounter of 01/31/18  DG HIP UNILAT W OR W/O PELVIS 2-3 VIEWS RIGHT   Narrative CLINICAL DATA:  27 year old female with chronic pain both hips and sacroiliac joints. Initial encounter.  EXAM: DG HIP (WITH OR WITHOUT PELVIS) 2-3V RIGHT  COMPARISON:  None.  FINDINGS: Preservation joint space. No  evidence of femoral head avascular necrosis. No fracture or dislocation.  IMPRESSION: Negative.  Electronically Signed   By: Genia Del M.D.   On: 01/31/2018 12:25    Hip-L DG 2-3 views:  Results for orders placed during the hospital encounter of 01/31/18  DG HIP UNILAT W OR W/O PELVIS 2-3 VIEWS LEFT   Narrative CLINICAL DATA:  27 year old female with chronic pain both hips and sacroiliac joints. Initial encounter.  EXAM: DG HIP (WITH OR WITHOUT PELVIS) 2-3V LEFT  COMPARISON:  None.  FINDINGS: Preservation joint space. No evidence of femoral head avascular necrosis. No fracture or dislocation.  IMPRESSION: Negative.   Electronically Signed   By: Genia Del M.D.   On: 01/31/2018 12:24    Complexity Note: Imaging results reviewed. Results shared with Ms. Palmeri, using Layman's terms.                         Meds   Current Outpatient Medications:  .  cholecalciferol (VITAMIN D) 1000 units tablet, Take 1,000 Units by mouth daily., Disp: , Rfl:  .  Cyanocobalamin (B-12 COMPLIANCE INJECTION) 1000 MCG/ML KIT, Inject as directed., Disp: , Rfl:  .  cyclobenzaprine (FLEXERIL) 5 MG tablet, Take by mouth., Disp: , Rfl:  .  doxylamine, Sleep, (UNISOM) 25 MG tablet, Take 25 mg by mouth at bedtime as needed., Disp: , Rfl:  .  folic acid-pyridoxine-cyancobalamin (FOLTX) 2.5-25-2 MG TABS tablet, Take 1 tablet by mouth daily., Disp: , Rfl:  .  levonorgestrel-ethinyl estradiol (ENPRESSE,TRIVORA) tablet, Take 1 tablet by mouth daily., Disp: , Rfl:  .  levothyroxine (SYNTHROID, LEVOTHROID) 25 MCG tablet, levothyroxine 25 mcg tablet, Disp: , Rfl:  .  Melatonin 1 MG CAPS, Take by mouth., Disp: , Rfl:  .  Multiple Vitamin (MULTIVITAMIN) capsule, Take 1 capsule by mouth daily., Disp: , Rfl:  .  traMADol (ULTRAM) 50 MG tablet, Take by mouth every 6 (six) hours as needed., Disp: , Rfl:  .  UNABLE TO FIND, Take 5 g by mouth as needed., Disp: , Rfl:   ROS  Constitutional: Denies any  fever or chills Gastrointestinal: No reported hemesis, hematochezia, vomiting, or acute GI distress Musculoskeletal: Denies any acute onset joint swelling, redness, loss of ROM, or weakness Neurological: No reported episodes of acute onset apraxia, aphasia, dysarthria, agnosia, amnesia, paralysis, loss of coordination, or loss of consciousness  Allergies  Ms. Omura is allergic to nsaids and doxycycline.  PFSH  Drug: Ms. Apgar  has no drug history on file. Alcohol:  has no alcohol history on file. Tobacco:  reports that she has never smoked. She has never used smokeless tobacco. Medical:  has a past medical history of Allergy, Fibromyalgia, Thyroid disease, and Vitamin B12 deficiency (non anemic). Surgical: Ms. Mofield  has a past surgical history that includes gallbadder and Laparoscopic gastric sleeve resection. Family: family history includes Diabetes in her brother; Heart disease in her brother; Hypertension in her brother and father; Thyroid disease in her mother.  Constitutional Exam  General appearance: Well nourished, well developed, and well hydrated. In no apparent acute distress Vitals:   03/01/18 1003  BP: (!) 133/97  Pulse: 68  Temp: 98.2 F (36.8 C)  SpO2: 100%  Weight: 200 lb (90.7 kg)  Height: 5' 5"  (1.651 m)   BMI Assessment: Estimated body mass index is 33.28 kg/m as calculated from the following:   Height as of this encounter: 5' 5"  (1.651 m).   Weight as of this encounter: 200 lb (90.7 kg).  BMI interpretation table: BMI level Category Range association with higher  incidence of chronic pain  <18 kg/m2 Underweight   18.5-24.9 kg/m2 Ideal body weight   25-29.9 kg/m2 Overweight Increased incidence by 20%  30-34.9 kg/m2 Obese (Class I) Increased incidence by 68%  35-39.9 kg/m2 Severe obesity (Class II) Increased incidence by 136%  >40 kg/m2 Extreme obesity (Class III) Increased incidence by 254%   Patient's current BMI Ideal Body weight  Body mass index is  33.28 kg/m. Ideal body weight: 57 kg (125 lb 10.6 oz) Adjusted ideal body weight: 70.5 kg (155 lb 6.4 oz)   BMI Readings from Last 4 Encounters:  03/07/18 32.95 kg/m  03/01/18 33.28 kg/m  01/31/18 32.28 kg/m   Wt Readings from Last 4 Encounters:  03/07/18 198 lb (89.8 kg)  03/01/18 200 lb (90.7 kg)  01/31/18 194 lb (88 kg)  Psych/Mental status: Alert, oriented x 3 (person, place, & time)       Eyes: PERLA Respiratory: No evidence of acute respiratory distress  Cervical Spine Area Exam  Skin & Axial Inspection: No masses, redness, edema, swelling, or associated skin lesions Alignment: Symmetrical Functional ROM: Unrestricted ROM      Stability: No instability detected Muscle Tone/Strength: Functionally intact. No obvious neuro-muscular anomalies detected. Sensory (Neurological): Unimpaired Palpation: No palpable anomalies              Upper Extremity (UE) Exam    Side: Right upper extremity  Side: Left upper extremity  Skin & Extremity Inspection: Skin color, temperature, and hair growth are WNL. No peripheral edema or cyanosis. No masses, redness, swelling, asymmetry, or associated skin lesions. No contractures.  Skin & Extremity Inspection: Skin color, temperature, and hair growth are WNL. No peripheral edema or cyanosis. No masses, redness, swelling, asymmetry, or associated skin lesions. No contractures.  Functional ROM: Unrestricted ROM          Functional ROM: Unrestricted ROM          Muscle Tone/Strength: Functionally intact. No obvious neuro-muscular anomalies detected.  Muscle Tone/Strength: Functionally intact. No obvious neuro-muscular anomalies detected.  Sensory (Neurological): Unimpaired          Sensory (Neurological): Unimpaired          Palpation: No palpable anomalies              Palpation: No palpable anomalies              Provocative Test(s):  Phalen's test: deferred Tinel's test: deferred Apley's scratch test (touch opposite shoulder):  Action 1 (Across  chest): deferred Action 2 (Overhead): deferred Action 3 (LB reach): deferred   Provocative Test(s):  Phalen's test: deferred Tinel's test: deferred Apley's scratch test (touch opposite shoulder):  Action 1 (Across chest): deferred Action 2 (Overhead): deferred Action 3 (LB reach): deferred    Thoracic Spine Area Exam  Skin & Axial Inspection: No masses, redness, or swelling Alignment: Symmetrical Functional ROM: Unrestricted ROM Stability: No instability detected Muscle Tone/Strength: Functionally intact. No obvious neuro-muscular anomalies detected. Sensory (Neurological): Unimpaired Muscle strength & Tone: No palpable anomalies  Lumbar Spine Area Exam  Skin & Axial Inspection: No masses, redness, or swelling Alignment: Symmetrical Functional ROM: Decreased ROM       Stability: No instability detected Muscle Tone/Strength: Increased muscle tone over affected area Sensory (Neurological): Movement-associated pain Palpation: Complains of area being tender to palpation       Provocative Tests: Hyperextension/rotation test: (+) bilaterally for facet joint pain. Lumbar quadrant test (Kemp's test): (+) bilaterally for facet joint pain. Lateral bending test: deferred today  Patrick's Maneuver: (+) for bilateral hip arthralgia             FABER test: (+) for bilateral hip arthralgia             S-I anterior distraction/compression test: deferred today         S-I lateral compression test: deferred today         S-I Thigh-thrust test: deferred today         S-I Gaenslen's test: deferred today          Gait & Posture Assessment  Ambulation: Limited Gait: Antalgic Posture: Antalgic   Lower Extremity Exam    Side: Right lower extremity  Side: Left lower extremity  Stability: No instability observed          Stability: No instability observed          Skin & Extremity Inspection: Skin color, temperature, and hair growth are WNL. No peripheral edema or cyanosis. No masses,  redness, swelling, asymmetry, or associated skin lesions. No contractures.  Skin & Extremity Inspection: Skin color, temperature, and hair growth are WNL. No peripheral edema or cyanosis. No masses, redness, swelling, asymmetry, or associated skin lesions. No contractures.  Functional ROM: Unrestricted ROM                  Functional ROM: Unrestricted ROM                  Muscle Tone/Strength: Functionally intact. No obvious neuro-muscular anomalies detected.  Muscle Tone/Strength: Functionally intact. No obvious neuro-muscular anomalies detected.  Sensory (Neurological): Unimpaired  Sensory (Neurological): Unimpaired  Palpation: No palpable anomalies  Palpation: No palpable anomalies   Assessment & Plan  Primary Diagnosis & Pertinent Problem List: The primary encounter diagnosis was Chronic low back pain (Primary Area of Pain) (Bilateral) w/o sciatica. Diagnoses of Chronic sacroiliac joint pain (Bilateral), Chronic hip pain (Bilateral), Chronic lower extremity pain (Secondary Area of Pain) (Bilateral), Chronic neck pain (Tertiary Area of Pain) (Bilateral) (R>L), Cervicalgia, Cervical facet syndrome (Bilateral) (R>L), Disorder of skeletal system, Lumbar facet joint syndrome (B) (R>L), and Spondylosis without myelopathy or radiculopathy, lumbosacral region were also pertinent to this visit.  Visit Diagnosis: 1. Chronic low back pain (Primary Area of Pain) (Bilateral) w/o sciatica   2. Chronic sacroiliac joint pain (Bilateral)   3. Chronic hip pain (Bilateral)   4. Chronic lower extremity pain (Secondary Area of Pain) (Bilateral)   5. Chronic neck pain (Tertiary Area of Pain) (Bilateral) (R>L)   6. Cervicalgia   7. Cervical facet syndrome (Bilateral) (R>L)   8. Disorder of skeletal system   9. Lumbar facet joint syndrome (B) (R>L)   10. Spondylosis without myelopathy or radiculopathy, lumbosacral region    Problems updated and reviewed during this visit: No problems updated. Time Note:  Greater than 50% of the 40 minute(s) of face-to-face time spent with Ms. [place]un[place], was spent in counseling/coordination of care regarding: the appropriate use of the pain scale, Ms. Liverman's primary cause of pain, the results of her recent test(s), the significance of each one oth the test(s) anomalies and it's corresponding characteristic pain pattern(s), the treatment plan, treatment alternatives, the risks and possible complications of proposed treatment, realistic expectations and the need to collect and read the AVS material.  Plan of Care  Pharmacotherapy (Medications Ordered): No orders of the defined types were placed in this encounter.   Procedure Orders     LUMBAR FACET(MEDIAL BRANCH NERVE BLOCK) [facility] Lab Orders  No laboratory test(s)  ordered today   Imaging Orders  No imaging studies ordered today    Referral Orders  No referral(s) requested today    Pharmacological management options:  Opioid Analgesics: We'll take over management today. See above orders Membrane stabilizer: We have discussed the possibility of optimizing this mode of therapy, if tolerated Muscle relaxant: We have discussed the possibility of a trial NSAID: We have discussed the possibility of a trial Other analgesic(s): To be determined at a later time   Interventional management options: Planned, scheduled, and/or pending:    Diagnostic bilateral lumbar facet block #1    Considering:   Diagnostic trigger point injections  Diagnostic bilateral sacroiliac joint injections  Possible bilateral sacroiliac joint RFA  Diagnostic bilateral lumbar facet block  Possible bilateral lumbar facet RFA  Diagnostic bilateral intra-articular hip joint injection  Diagnostic bilateral femoral nerve + obturator nerve block  Possible bilateral femoral nerve + obturator nerve RFA  Diagnostic L4-5 interlaminar LESI  Diagnostic right-sided Cervical ESI  Diagnostic bilateral cervical facet block  Possible bilateral cervical  facet RFA    PRN Procedures:   None at this time   Provider-requested follow-up: Return for Procedure (w/ sedation): (B) L-FCT BLK #1.  Future Appointments  Date Time Provider [place]  03/27/2018  9:15 AM Milinda Pointer, MD [facility] None    Primary Care Physician: Orlena Sheldon Location: [facility] Note by: Gaspar Cola, MD Date: 03/01/2018; Time: 11:49 AM

## 2018-03-01 ENCOUNTER — Ambulatory Visit: Payer: BLUE CROSS/BLUE SHIELD | Attending: Pain Medicine | Admitting: Pain Medicine

## 2018-03-01 ENCOUNTER — Other Ambulatory Visit: Payer: Self-pay

## 2018-03-01 ENCOUNTER — Encounter: Payer: Self-pay | Admitting: Pain Medicine

## 2018-03-01 VITALS — BP 133/97 | HR 68 | Temp 98.2°F | Ht 65.0 in | Wt 200.0 lb

## 2018-03-01 DIAGNOSIS — M542 Cervicalgia: Secondary | ICD-10-CM | POA: Diagnosis not present

## 2018-03-01 DIAGNOSIS — M533 Sacrococcygeal disorders, not elsewhere classified: Secondary | ICD-10-CM | POA: Insufficient documentation

## 2018-03-01 DIAGNOSIS — Z833 Family history of diabetes mellitus: Secondary | ICD-10-CM | POA: Insufficient documentation

## 2018-03-01 DIAGNOSIS — R109 Unspecified abdominal pain: Secondary | ICD-10-CM | POA: Insufficient documentation

## 2018-03-01 DIAGNOSIS — M47816 Spondylosis without myelopathy or radiculopathy, lumbar region: Secondary | ICD-10-CM | POA: Diagnosis not present

## 2018-03-01 DIAGNOSIS — M79605 Pain in left leg: Secondary | ICD-10-CM

## 2018-03-01 DIAGNOSIS — M47812 Spondylosis without myelopathy or radiculopathy, cervical region: Secondary | ICD-10-CM

## 2018-03-01 DIAGNOSIS — E039 Hypothyroidism, unspecified: Secondary | ICD-10-CM | POA: Insufficient documentation

## 2018-03-01 DIAGNOSIS — G894 Chronic pain syndrome: Secondary | ICD-10-CM | POA: Insufficient documentation

## 2018-03-01 DIAGNOSIS — Z09 Encounter for follow-up examination after completed treatment for conditions other than malignant neoplasm: Secondary | ICD-10-CM | POA: Insufficient documentation

## 2018-03-01 DIAGNOSIS — M79604 Pain in right leg: Secondary | ICD-10-CM | POA: Insufficient documentation

## 2018-03-01 DIAGNOSIS — M25551 Pain in right hip: Secondary | ICD-10-CM | POA: Diagnosis not present

## 2018-03-01 DIAGNOSIS — M797 Fibromyalgia: Secondary | ICD-10-CM | POA: Diagnosis not present

## 2018-03-01 DIAGNOSIS — F329 Major depressive disorder, single episode, unspecified: Secondary | ICD-10-CM | POA: Diagnosis not present

## 2018-03-01 DIAGNOSIS — R5381 Other malaise: Secondary | ICD-10-CM | POA: Diagnosis not present

## 2018-03-01 DIAGNOSIS — R5383 Other fatigue: Secondary | ICD-10-CM | POA: Insufficient documentation

## 2018-03-01 DIAGNOSIS — K8020 Calculus of gallbladder without cholecystitis without obstruction: Secondary | ICD-10-CM | POA: Diagnosis not present

## 2018-03-01 DIAGNOSIS — M25552 Pain in left hip: Secondary | ICD-10-CM | POA: Insufficient documentation

## 2018-03-01 DIAGNOSIS — D510 Vitamin B12 deficiency anemia due to intrinsic factor deficiency: Secondary | ICD-10-CM | POA: Diagnosis not present

## 2018-03-01 DIAGNOSIS — M545 Low back pain: Secondary | ICD-10-CM

## 2018-03-01 DIAGNOSIS — Z79899 Other long term (current) drug therapy: Secondary | ICD-10-CM | POA: Diagnosis not present

## 2018-03-01 DIAGNOSIS — Z886 Allergy status to analgesic agent status: Secondary | ICD-10-CM | POA: Insufficient documentation

## 2018-03-01 DIAGNOSIS — Z79890 Hormone replacement therapy (postmenopausal): Secondary | ICD-10-CM | POA: Diagnosis not present

## 2018-03-01 DIAGNOSIS — M47817 Spondylosis without myelopathy or radiculopathy, lumbosacral region: Secondary | ICD-10-CM | POA: Diagnosis not present

## 2018-03-01 DIAGNOSIS — Z8249 Family history of ischemic heart disease and other diseases of the circulatory system: Secondary | ICD-10-CM | POA: Insufficient documentation

## 2018-03-01 DIAGNOSIS — Z79891 Long term (current) use of opiate analgesic: Secondary | ICD-10-CM | POA: Insufficient documentation

## 2018-03-01 DIAGNOSIS — Z6833 Body mass index (BMI) 33.0-33.9, adult: Secondary | ICD-10-CM | POA: Insufficient documentation

## 2018-03-01 DIAGNOSIS — Z8379 Family history of other diseases of the digestive system: Secondary | ICD-10-CM | POA: Insufficient documentation

## 2018-03-01 DIAGNOSIS — M899 Disorder of bone, unspecified: Secondary | ICD-10-CM

## 2018-03-01 DIAGNOSIS — G8929 Other chronic pain: Secondary | ICD-10-CM

## 2018-03-01 DIAGNOSIS — E785 Hyperlipidemia, unspecified: Secondary | ICD-10-CM | POA: Insufficient documentation

## 2018-03-01 DIAGNOSIS — Z9889 Other specified postprocedural states: Secondary | ICD-10-CM | POA: Insufficient documentation

## 2018-03-01 DIAGNOSIS — Z881 Allergy status to other antibiotic agents status: Secondary | ICD-10-CM | POA: Insufficient documentation

## 2018-03-01 NOTE — Patient Instructions (Addendum)
____________________________________________________________________________________________  Pain Scale  Introduction: The pain score used by this practice is the Verbal Numerical Rating Scale (VNRS-11). This is an 11-point scale. It is for adults and children 10 years or older. There are significant differences in how the pain score is reported, used, and applied. Forget everything you learned in the past and learn this scoring system.  General Information: The scale should reflect your current level of pain. Unless you are specifically asked for the level of your worst pain, or your average pain. If you are asked for one of these two, then it should be understood that it is over the past 24 hours.  Basic Activities of Daily Living (ADL): Personal hygiene, dressing, eating, transferring, and using restroom.  Instructions: Most patients tend to report their level of pain as a combination of two factors, their physical pain and their psychosocial pain. This last one is also known as "suffering" and it is reflection of how physical pain affects you socially and psychologically. From now on, report them separately. From this point on, when asked to report your pain level, report only your physical pain. Use the following table for reference.  Pain Clinic Pain Levels (0-5/10)  Pain Level Score  Description  No Pain 0   Mild pain 1 Nagging, annoying, but does not interfere with basic activities of daily living (ADL). Patients are able to eat, bathe, get dressed, toileting (being able to get on and off the toilet and perform personal hygiene functions), transfer (move in and out of bed or a chair without assistance), and maintain continence (able to control bladder and bowel functions). Blood pressure and heart rate are unaffected. A normal heart rate for a healthy adult ranges from 60 to 100 bpm (beats per minute).   Mild to moderate pain 2 Noticeable and distracting. Impossible to hide from other  people. More frequent flare-ups. Still possible to adapt and function close to normal. It can be very annoying and may have occasional stronger flare-ups. With discipline, patients may get used to it and adapt.   Moderate pain 3 Interferes significantly with activities of daily living (ADL). It becomes difficult to feed, bathe, get dressed, get on and off the toilet or to perform personal hygiene functions. Difficult to get in and out of bed or a chair without assistance. Very distracting. With effort, it can be ignored when deeply involved in activities.   Moderately severe pain 4 Impossible to ignore for more than a few minutes. With effort, patients may still be able to manage work or participate in some social activities. Very difficult to concentrate. Signs of autonomic nervous system discharge are evident: dilated pupils (mydriasis); mild sweating (diaphoresis); sleep interference. Heart rate becomes elevated (>115 bpm). Diastolic blood pressure (lower number) rises above 100 mmHg. Patients find relief in laying down and not moving.   Severe pain 5 Intense and extremely unpleasant. Associated with frowning face and frequent crying. Pain overwhelms the senses.  Ability to do any activity or maintain social relationships becomes significantly limited. Conversation becomes difficult. Pacing back and forth is common, as getting into a comfortable position is nearly impossible. Pain wakes you up from deep sleep. Physical signs will be obvious: pupillary dilation; increased sweating; goosebumps; brisk reflexes; cold, clammy hands and feet; nausea, vomiting or dry heaves; loss of appetite; significant sleep disturbance with inability to fall asleep or to remain asleep. When persistent, significant weight loss is observed due to the complete loss of appetite and sleep deprivation.  Blood   pressure and heart rate becomes significantly elevated. Caution: If elevated blood pressure triggers a pounding headache  associated with blurred vision, then the patient should immediately seek attention at an urgent or emergency care unit, as these may be signs of an impending stroke.    Emergency Department Pain Levels (6-10/10)  Emergency Room Pain 6 Severely limiting. Requires emergency care and should not be seen or managed at an outpatient pain management facility. Communication becomes difficult and requires great effort. Assistance to reach the emergency department may be required. Facial flushing and profuse sweating along with potentially dangerous increases in heart rate and blood pressure will be evident.   Distressing pain 7 Self-care is very difficult. Assistance is required to transport, or use restroom. Assistance to reach the emergency department will be required. Tasks requiring coordination, such as bathing and getting dressed become very difficult.   Disabling pain 8 Self-care is no longer possible. At this level, pain is disabling. The individual is unable to do even the most "basic" activities such as walking, eating, bathing, dressing, transferring to a bed, or toileting. Fine motor skills are lost. It is difficult to think clearly.   Incapacitating pain 9 Pain becomes incapacitating. Thought processing is no longer possible. Difficult to remember your own name. Control of movement and coordination are lost.   The worst pain imaginable 10 At this level, most patients pass out from pain. When this level is reached, collapse of the autonomic nervous system occurs, leading to a sudden drop in blood pressure and heart rate. This in turn results in a temporary and dramatic drop in blood flow to the brain, leading to a loss of consciousness. Fainting is one of the body's self defense mechanisms. Passing out puts the brain in a calmed state and causes it to shut down for a while, in order to begin the healing process.    Summary: 1. Refer to this scale when providing us with your pain level. 2. Be  accurate and careful when reporting your pain level. This will help with your care. 3. Over-reporting your pain level will lead to loss of credibility. 4. Even a level of 1/10 means that there is pain and will be treated at our facility. 5. High, inaccurate reporting will be documented as "Symptom Exaggeration", leading to loss of credibility and suspicions of possible secondary gains such as obtaining more narcotics, or wanting to appear disabled, for fraudulent reasons. 6. Only pain levels of 5 or below will be seen at our facility. 7. Pain levels of 6 and above will be sent to the Emergency Department and the appointment cancelled. ____________________________________________________________________________________________   ____________________________________________________________________________________________  Preparing for Procedure with Sedation  Instructions: . Oral Intake: Do not eat or drink anything for at least 8 hours prior to your procedure. . Transportation: Public transportation is not allowed. Bring an adult driver. The driver must be physically present in our waiting room before any procedure can be started. . Physical Assistance: Bring an adult physically capable of assisting you, in the event you need help. This adult should keep you company at home for at least 6 hours after the procedure. . Blood Pressure Medicine: Take your blood pressure medicine with a sip of water the morning of the procedure. . Blood thinners: Notify our staff if you are taking any blood thinners. Depending on which one you take, there will be specific instructions on how and when to stop it. . Diabetics on insulin: Notify the staff so that you can be   scheduled 1st case in the morning. If your diabetes requires high dose insulin, take only  of your normal insulin dose the morning of the procedure and notify the staff that you have done so. . Preventing infections: Shower with an antibacterial soap  the morning of your procedure. . Build-up your immune system: Take 1000 mg of Vitamin C with every meal (3 times a day) the day prior to your procedure. Marland Kitchen. Antibiotics: Inform the staff if you have a condition or reason that requires you to take antibiotics before dental procedures. . Pregnancy: If you are pregnant, call and cancel the procedure. . Sickness: If you have a cold, fever, or any active infections, call and cancel the procedure. . Arrival: You must be in the facility at least 30 minutes prior to your scheduled procedure. . Children: Do not bring children with you. . Dress appropriately: Bring dark clothing that you would not mind if they get stained. . Valuables: Do not bring any jewelry or valuables.  Procedure appointments are reserved for interventional treatments only. Marland Kitchen. No Prescription Refills. . No medication changes will be discussed during procedure appointments. . No disability issues will be discussed.  Reasons to call and reschedule or cancel your procedure: (Following these recommendations will minimize the risk of a serious complication.) . Surgeries: Avoid having procedures within 2 weeks of any surgery. (Avoid for 2 weeks before or after any surgery). . Flu Shots: Avoid having procedures within 2 weeks of a flu shots or . (Avoid for 2 weeks before or after immunizations). . Barium: Avoid having a procedure within 7-10 days after having had a radiological study involving the use of radiological contrast. (Myelograms, Barium swallow or enema study). . Heart attacks: Avoid any elective procedures or surgeries for the initial 6 months after a "Myocardial Infarction" (Heart Attack). . Blood thinners: It is imperative that you stop these medications before procedures. Let us know if you if you take any blood thinner.  . Infection: Avoid procedures during or within two weeks of an infection (including chest colds or gastrointestinal problems). Symptoms associated with  infections include: Localized redness, fever, chills, night sweats or profuse sweating, burning sensation when voiding, cough, congestion, stuffiness, runny nose, sore throat, diarrhea, nausea, vomiting, cold or Flu symptoms, recent or current infections. It is specially important if the infection is over the area that we intend to treat. Marland Kitchen. Heart and lung problems: Symptoms that may suggest an active cardiopulmonary problem include: cough, chest pain, breathing difficulties or shortness of breath, dizziness, ankle swelling, uncontrolled high or unusually low blood pressure, and/or palpitations. If you are experiencing any of these symptoms, cancel your procedure and contact your primary care physician for an evaluation.  Remember:  Regular Business hours are:  Monday to Thursday 8:00 AM to 4:00 PM  Provider's Schedule: Delano MetzFrancisco Temekia Caskey, MD:  Procedure days: Tuesday and Thursday 7:30 AM to 4:00 PM  Edward JollyBilal Lateef, MD:  Procedure days: Monday and Wednesday 7:30 AM to 4:00 PM ____________________________________________________________________________________________   Preparing for Procedure with Sedation Instructions: . Oral Intake: Do not eat or drink anything for at least 8 hours prior to your procedure. . Transportation: Public transportation is not allowed. Bring an adult driver. The driver must be physically present in our waiting room before any procedure can be started. Marland Kitchen. Physical Assistance: Bring an adult capable of physically assisting you, in the event you need help. . Blood Pressure Medicine: Take your blood pressure medicine with a sip of water the morning of the  procedure. . Insulin: Take only  of your normal insulin dose. . Preventing infections: Shower with an antibacterial soap the morning of your procedure. . Build-up your immune system: Take 1000 mg of Vitamin C with every meal (3 times a day) the day prior to your procedure. . Pregnancy: If you are pregnant, call and  cancel the procedure. . Sickness: If you have a cold, fever, or any active infections, call and cancel the procedure. . Arrival: You must be in the facility at least 30 minutes prior to your scheduled procedure. . Children: Do not bring children with you. . Dress appropriately: Bring dark clothing that you would not mind if they get stained. . Valuables: Do not bring any jewelry or valuables. Procedure appointments are reserved for interventional treatments only. Marland Kitchen. No Prescription Refills. . No medication changes will be discussed during procedure appointments. . No disability issues will be discussed.

## 2018-03-07 ENCOUNTER — Other Ambulatory Visit: Payer: Self-pay

## 2018-03-07 ENCOUNTER — Ambulatory Visit
Admission: RE | Admit: 2018-03-07 | Discharge: 2018-03-07 | Disposition: A | Discharge status: Home / Self Care | Payer: BLUE CROSS/BLUE SHIELD | Source: Ambulatory Visit | Attending: Pain Medicine | Admitting: Pain Medicine

## 2018-03-07 ENCOUNTER — Ambulatory Visit (HOSPITAL_BASED_OUTPATIENT_CLINIC_OR_DEPARTMENT_OTHER): Payer: BLUE CROSS/BLUE SHIELD | Admitting: Pain Medicine

## 2018-03-07 VITALS — BP 120/81 | HR 60 | Temp 98.6°F | Resp 20 | Ht 65.0 in | Wt 198.0 lb

## 2018-03-07 DIAGNOSIS — G8929 Other chronic pain: Secondary | ICD-10-CM | POA: Insufficient documentation

## 2018-03-07 DIAGNOSIS — M47817 Spondylosis without myelopathy or radiculopathy, lumbosacral region: Secondary | ICD-10-CM

## 2018-03-07 DIAGNOSIS — M545 Low back pain: Secondary | ICD-10-CM

## 2018-03-07 DIAGNOSIS — Z881 Allergy status to other antibiotic agents status: Secondary | ICD-10-CM | POA: Insufficient documentation

## 2018-03-07 DIAGNOSIS — Z886 Allergy status to analgesic agent status: Secondary | ICD-10-CM | POA: Diagnosis not present

## 2018-03-07 DIAGNOSIS — M47816 Spondylosis without myelopathy or radiculopathy, lumbar region: Secondary | ICD-10-CM

## 2018-03-07 MED ORDER — LIDOCAINE HCL 2 % IJ SOLN
20.0000 mL | Freq: Once | INTRAMUSCULAR | Status: AC
  Administered 2018-03-07: 400 mg
  Filled 2018-03-07: qty 40

## 2018-03-07 MED ORDER — ROPIVACAINE HCL 2 MG/ML IJ SOLN
18.0000 mL | Freq: Once | INTRAMUSCULAR | Status: AC
  Administered 2018-03-07: 10 mL via PERINEURAL
  Filled 2018-03-07: qty 20

## 2018-03-07 MED ORDER — TRIAMCINOLONE ACETONIDE 40 MG/ML IJ SUSP
80.0000 mg | Freq: Once | INTRAMUSCULAR | Status: AC
  Administered 2018-03-07: 40 mg
  Filled 2018-03-07: qty 2

## 2018-03-07 MED ORDER — LACTATED RINGERS IV SOLN
1000.0000 mL | Freq: Once | INTRAVENOUS | Status: AC
  Administered 2018-03-07: 1000 mL via INTRAVENOUS

## 2018-03-07 MED ORDER — MIDAZOLAM HCL 5 MG/5ML IJ SOLN
1.0000 mg | INTRAMUSCULAR | Status: DC | PRN
  Administered 2018-03-07: 4 mg via INTRAVENOUS
  Filled 2018-03-07: qty 5

## 2018-03-07 MED ORDER — FENTANYL CITRATE (PF) 100 MCG/2ML IJ SOLN
25.0000 ug | INTRAMUSCULAR | Status: DC | PRN
  Administered 2018-03-07: 100 ug via INTRAVENOUS
  Filled 2018-03-07: qty 2

## 2018-03-07 NOTE — Patient Instructions (Addendum)
____________________________________________________________________________________________  Post-Procedure Discharge Instructions  Instructions:  Apply ice: Fill a plastic sandwich bag with crushed ice. Cover it with a small towel and apply to injection site. Apply for 15 minutes then remove x 15 minutes. Repeat sequence on day of procedure, until you go to bed. The purpose is to minimize swelling and discomfort after procedure.  Apply heat: Apply heat to procedure site starting the day following the procedure. The purpose is to treat any soreness and discomfort from the procedure.  Food intake: Start with clear liquids (like water) and advance to regular food, as tolerated.   Physical activities: Keep activities to a minimum for the first 8 hours after the procedure.   Driving: If you have received any sedation, you are not allowed to drive for 24 hours after your procedure.  Blood thinner: Restart your blood thinner 6 hours after your procedure. (Only for those taking blood thinners)  Insulin: As soon as you can eat, you may resume your normal dosing schedule. (Only for those taking insulin)  Infection prevention: Keep procedure site clean and dry.  Post-procedure Pain Diary: Extremely important that this be done correctly and accurately. Recorded information will be used to determine the next step in treatment.  Pain evaluated is that of treated area only. Do not include pain from an untreated area.  Complete every hour, on the hour, for the initial 8 hours. Set an alarm to help you do this part accurately.  Do not go to sleep and have it completed later. It will not be accurate.  Follow-up appointment: Keep your follow-up appointment after the procedure. Usually 2 weeks for most procedures. (6 weeks in the case of radiofrequency.) Bring you pain diary.   Expect:  From numbing medicine (AKA: Local Anesthetics): Numbness or decrease in pain.  Onset: Full effect within 15  minutes of injected.  Duration: It will depend on the type of local anesthetic used. On the average, 1 to 8 hours.   From steroids: Decrease in swelling or inflammation. Once inflammation is improved, relief of the pain will follow.  Onset of benefits: Depends on the amount of swelling present. The more swelling, the longer it will take for the benefits to be seen. In some cases, up to 10 days.  Duration: Steroids will stay in the system x 2 weeks. Duration of benefits will depend on multiple posibilities including persistent irritating factors.  From procedure: Some discomfort is to be expected once the numbing medicine wears off. This should be minimal if ice and heat are applied as instructed.  Call if:  You experience numbness and weakness that gets worse with time, as opposed to wearing off.  New onset bowel or bladder incontinence. (This applies to Spinal procedures only)  Emergency Numbers:  Durning business hours (Monday - Thursday, 8:00 AM - 4:00 PM) (Friday, 9:00 AM - 12:00 Noon): [phone]  After hours: [phone] ____________________________________________________________________________________________   Pain Management Discharge Instructions  General Discharge Instructions :  If you need to reach your doctor call: Monday-Friday 8:00 am - 4:00 pm at [phone] or toll free [phone].  After clinic hours [phone] to have operator reach doctor.  Bring all of your medication bottles to all your appointments in the pain clinic.  To cancel or reschedule your appointment with Pain Management please remember to call 24 hours in advance to avoid a fee.  Refer to the educational materials which you have been given on: General Risks, I had my Procedure. Discharge Instructions, Post Sedation.    Post Procedure Instructions:  The drugs you were given will stay in your system until tomorrow, so for the next 24 hours you should not drive, make any legal  decisions or drink any alcoholic beverages.  You may eat anything you prefer, but it is better to start with liquids then soups and crackers, and gradually work up to solid foods.  Please notify your doctor immediately if you have any unusual bleeding, trouble breathing or pain that is not related to your normal pain.  Depending on the type of procedure that was done, some parts of your body may feel week and/or numb.  This usually clears up by tonight or the next day.  Walk with the use of an assistive device or accompanied by an adult for the 24 hours.  You may use ice on the affected area for the first 24 hours.  Put ice in a Ziploc bag and cover with a towel and place against area 15 minutes on 15 minutes off.  You may switch to heat after 24 hours.Facet Blocks Patient Information  Description: The facets are joints in the spine between the vertebrae.  Like any joints in the body, facets can become irritated and painful.  Arthritis can also effect the facets.  By injecting steroids and local anesthetic in and around these joints, we can temporarily block the nerve supply to them.  Steroids act directly on irritated nerves and tissues to reduce selling and inflammation which often leads to decreased pain.  Facet blocks may be done anywhere along the spine from the neck to the low back depending upon the location of your pain.   After numbing the skin with local anesthetic (like Novocaine), a small needle is passed onto the facet joints under x-ray guidance.  You may experience a sensation of pressure while this is being done.  The entire block usually lasts about 15-25 minutes.   Conditions which may be treated by facet blocks:   Low back/buttock pain  Neck/shoulder pain  Certain types of headaches  Preparation for the injection:  1. Do not eat any solid food or dairy products within 8 hours of your appointment. 2. You may drink clear liquid up to 3 hours before appointment.  Clear  liquids include water, black coffee, juice or soda.  No milk or cream please. 3. You may take your regular medication, including pain medications, with a sip of water before your appointment.  Diabetics should hold regular insulin (if taken separately) and take 1/2 normal NPH dose the morning of the procedure.  Carry some sugar containing items with you to your appointment. 4. A driver must accompany you and be prepared to drive you home after your procedure. 5. Bring all your current medications with you. 6. An IV may be inserted and sedation may be given at the discretion of the physician. 7. A blood pressure cuff, EKG and other monitors will often be applied during the procedure.  Some patients may need to have extra oxygen administered for a short period. 8. You will be asked to provide medical information, including your allergies and medications, prior to the procedure.  We must know immediately if you are taking blood thinners (like Coumadin/Warfarin) or if you are allergic to IV iodine contrast (dye).  We must know if you could possible be pregnant.  Possible side-effects:   Bleeding from needle site  Infection (rare, may require surgery)  Nerve injury (rare)  Numbness & tingling (temporary)  Difficulty urinating (rare, temporary)    Spinal headache (a headache worse with upright posture)  Light-headedness (temporary)  Pain at injection site (serveral days)  Decreased blood pressure (rare, temporary)  Weakness in arm/leg (temporary)  Pressure sensation in back/neck (temporary)   Call if you experience:   Fever/chills associated with headache or increased back/neck pain  Headache worsened by an upright position  New onset, weakness or numbness of an extremity below the injection site  Hives or difficulty breathing (go to the emergency room)  Inflammation or drainage at the injection site(s)  Severe back/neck pain greater than usual  New symptoms which are  concerning to you  Please note:  Although the local anesthetic injected can often make your back or neck feel good for several hours after the injection, the pain will likely return. It takes 3-7 days for steroids to work.  You may not notice any pain relief for at least one week.  If effective, we will often do a series of 2-3 injections spaced 3-6 weeks apart to maximally decrease your pain.  After the initial series, you may be a candidate for a more permanent nerve block of the facets.  If you have any questions, please call [phone]  [facility] 

## 2018-03-07 NOTE — Progress Notes (Signed)
Patient's Name: Holly Lopez  MRN: [id number]  Referring Provider: Darvin Neighboursornelio, Oscar A, MD  DOB: October 27, 1990  PCP: Darvin Neighboursornelio, Oscar A  DOS: 03/07/2018  Note by: Oswaldo DoneFrancisco A Leodan Bolyard, MD  Service setting: Ambulatory outpatient  Specialty: Interventional Pain Management  Patient type: Established  Location: [facility] (AMB) Pain Management Facility  Visit type: Interventional Procedure   Primary Reason for Visit: Interventional Pain Management Treatment. CC: Back Pain (lower)  Procedure:          Anesthesia, Analgesia, Anxiolysis:  Type: Lumbar Facet, Medial Branch Block(s) #1  Primary Purpose: Diagnostic Region: Posterolateral Lumbosacral Spine Level: L2, L3, L4, L5, & S1 Medial Branch Level(s). Injecting these levels blocks the L3-4, L4-5, and L5-S1 lumbar facet joints. Laterality: Bilateral  Type: Moderate (Conscious) Sedation combined with Local Anesthesia Indication(s): Analgesia and Anxiety Route: Intravenous (IV) IV Access: Secured Sedation: Meaningful verbal contact was maintained at all times during the procedure  Local Anesthetic: Lidocaine 1-2%   Indications: 1. Spondylosis without myelopathy or radiculopathy, lumbosacral region   2. Lumbar facet joint syndrome (B) (R>L)   3. Chronic low back pain (Primary Area of Pain) (Bilateral) w/o sciatica    Pain Score: Pre-procedure: 5 /10 Post-procedure: 0-No pain/10  Pre-op Assessment:  Holly Lopez is a 27 y.o. (year old), female patient, seen today for interventional treatment. She  has a past surgical history that includes gallbadder and Laparoscopic gastric sleeve resection. Holly Lopez has a current medication list which includes the following prescription(s): cholecalciferol, cyanocobalamin, cyclobenzaprine, doxylamine (sleep), folic acid-pyridoxine-cyancobalamin, levonorgestrel-ethinyl estradiol, levothyroxine, melatonin, multivitamin, tramadol, and UNABLE TO FIND, and the following Facility-Administered Medications: fentanyl, lactated ringers,  and midazolam. Her primarily concern today is the Back Pain (lower)  Initial Vital Signs:  Pulse/HCG Rate: 60ECG Heart Rate: 80 Temp: 98.3 F (36.8 C) Resp: 16 BP: (!) 136/91 SpO2: 100 %  BMI: Estimated body mass index is 32.95 kg/m as calculated from the following:   Height as of this encounter: 5\' 5"  (1.651 m).   Weight as of this encounter: 198 lb (89.8 kg).  Risk Assessment: Allergies: Reviewed. She is allergic to nsaids and doxycycline.  Allergy Precautions: None required Coagulopathies: Reviewed. None identified.  Blood-thinner therapy: None at this time Active Infection(s): Reviewed. None identified. Holly Lopez is afebrile  Site Confirmation: Holly Lopez was asked to confirm the procedure and laterality before marking the site Procedure checklist: Completed Consent: Before the procedure and under the influence of no sedative(s), amnesic(s), or anxiolytics, the patient was informed of the treatment options, risks and possible complications. To fulfill our ethical and legal obligations, as recommended by the American Medical Association's Code of Ethics, I have informed the patient of my clinical impression; the nature and purpose of the treatment or procedure; the risks, benefits, and possible complications of the intervention; the alternatives, including doing nothing; the risk(s) and benefit(s) of the alternative treatment(s) or procedure(s); and the risk(s) and benefit(s) of doing nothing. The patient was provided information about the general risks and possible complications associated with the procedure. These may include, but are not limited to: failure to achieve desired goals, infection, bleeding, organ or nerve damage, allergic reactions, paralysis, and death. In addition, the patient was informed of those risks and complications associated to Spine-related procedures, such as failure to decrease pain; infection (i.e.: Meningitis, epidural or intraspinal abscess); bleeding  (i.e.: epidural hematoma, subarachnoid hemorrhage, or any other type of intraspinal or peri-dural bleeding); organ or nerve damage (i.e.: Any type of peripheral nerve, nerve root, or spinal cord injury)  with subsequent damage to sensory, motor, and/or autonomic systems, resulting in permanent pain, numbness, and/or weakness of one or several areas of the body; allergic reactions; (i.e.: anaphylactic reaction); and/or death. Furthermore, the patient was informed of those risks and complications associated with the medications. These include, but are not limited to: allergic reactions (i.e.: anaphylactic or anaphylactoid reaction(s)); adrenal axis suppression; blood sugar elevation that in diabetics may result in ketoacidosis or comma; water retention that in patients with history of congestive heart failure may result in shortness of breath, pulmonary edema, and decompensation with resultant heart failure; weight gain; swelling or edema; medication-induced neural toxicity; particulate matter embolism and blood vessel occlusion with resultant organ, and/or nervous system infarction; and/or aseptic necrosis of one or more joints. Finally, the patient was informed that Medicine is not an exact science; therefore, there is also the possibility of unforeseen or unpredictable risks and/or possible complications that may result in a catastrophic outcome. The patient indicated having understood very clearly. We have given the patient no guarantees and we have made no promises. Enough time was given to the patient to ask questions, all of which were answered to the patient's satisfaction. Ms. Kelty has indicated that she wanted to continue with the procedure. Attestation: I, the ordering provider, attest that I have discussed with the patient the benefits, risks, side-effects, alternatives, likelihood of achieving goals, and potential problems during recovery for the procedure that I have provided informed consent. Date   Time: 03/07/2018  1:19 PM  Pre-Procedure Preparation:  Monitoring: As per clinic protocol. Respiration, ETCO2, SpO2, BP, heart rate and rhythm monitor placed and checked for adequate function Safety Precautions: Patient was assessed for positional comfort and pressure points before starting the procedure. Time-out: I initiated and conducted the "Time-out" before starting the procedure, as per protocol. The patient was asked to participate by confirming the accuracy of the "Time Out" information. Verification of the correct person, site, and procedure were performed and confirmed by me, the nursing staff, and the patient. "Time-out" conducted as per Joint Commission's Universal Protocol (UP.01.01.01). Time: 1348  Description of Procedure:          Position: Prone Laterality: Bilateral. The procedure was performed in identical fashion on both sides. Levels:  L2, L3, L4, L5, & S1 Medial Branch Level(s) Area Prepped: Posterior Lumbosacral Region Prepping solution: ChloraPrep (2% chlorhexidine gluconate and 70% isopropyl alcohol) Safety Precautions: Aspiration looking for blood return was conducted prior to all injections. At no point did we inject any substances, as a needle was being advanced. Before injecting, the patient was told to immediately notify me if she was experiencing any new onset of "ringing in the ears, or metallic taste in the mouth". No attempts were made at seeking any paresthesias. Safe injection practices and needle disposal techniques used. Medications properly checked for expiration dates. SDV (single dose vial) medications used. After the completion of the procedure, all disposable equipment used was discarded in the proper designated medical waste containers. Local Anesthesia: Protocol guidelines were followed. The patient was positioned over the fluoroscopy table. The area was prepped in the usual manner. The time-out was completed. The target area was identified using  fluoroscopy. A 12-in long, straight, sterile hemostat was used with fluoroscopic guidance to locate the targets for each level blocked. Once located, the skin was marked with an approved surgical skin marker. Once all sites were marked, the skin (epidermis, dermis, and hypodermis), as well as deeper tissues (fat, connective tissue and muscle) were infiltrated with  a small amount of a short-acting local anesthetic, loaded on a 10cc syringe with a 25G, 1.5-in  Needle. An appropriate amount of time was allowed for local anesthetics to take effect before proceeding to the next step. Local Anesthetic: Lidocaine 2.0% The unused portion of the local anesthetic was discarded in the proper designated containers. Technical explanation of process:  L2 Medial Branch Nerve Block (MBB): The target area for the L2 medial branch is at the junction of the postero-lateral aspect of the superior articular process and the superior, posterior, and medial edge of the transverse process of L3. Under fluoroscopic guidance, a Quincke needle was inserted until contact was made with os over the superior postero-lateral aspect of the pedicular shadow (target area). After negative aspiration for blood, 0.5 mL of the nerve block solution was injected without difficulty or complication. The needle was removed intact. L3 Medial Branch Nerve Block (MBB): The target area for the L3 medial branch is at the junction of the postero-lateral aspect of the superior articular process and the superior, posterior, and medial edge of the transverse process of L4. Under fluoroscopic guidance, a Quincke needle was inserted until contact was made with os over the superior postero-lateral aspect of the pedicular shadow (target area). After negative aspiration for blood, 0.5 mL of the nerve block solution was injected without difficulty or complication. The needle was removed intact. L4 Medial Branch Nerve Block (MBB): The target area for the L4 medial branch  is at the junction of the postero-lateral aspect of the superior articular process and the superior, posterior, and medial edge of the transverse process of L5. Under fluoroscopic guidance, a Quincke needle was inserted until contact was made with os over the superior postero-lateral aspect of the pedicular shadow (target area). After negative aspiration for blood, 0.5 mL of the nerve block solution was injected without difficulty or complication. The needle was removed intact. L5 Medial Branch Nerve Block (MBB): The target area for the L5 medial branch is at the junction of the postero-lateral aspect of the superior articular process and the superior, posterior, and medial edge of the sacral ala. Under fluoroscopic guidance, a Quincke needle was inserted until contact was made with os over the superior postero-lateral aspect of the pedicular shadow (target area). After negative aspiration for blood, 0.5 mL of the nerve block solution was injected without difficulty or complication. The needle was removed intact. S1 Medial Branch Nerve Block (MBB): The target area for the S1 medial branch is at the posterior and inferior 6 o'clock position of the L5-S1 facet joint. Under fluoroscopic guidance, the Quincke needle inserted for the L5 MBB was redirected until contact was made with os over the inferior and postero aspect of the sacrum, at the 6 o' clock position under the L5-S1 facet joint (Target area). After negative aspiration for blood, 0.5 mL of the nerve block solution was injected without difficulty or complication. The needle was removed intact. Procedural Needles: 22-gauge, 3.5-inch, Quincke needles used for all levels. Nerve block solution: 0.2% PF-Ropivacaine + Triamcinolone (40 mg/mL) diluted to a final concentration of 4 mg of Triamcinolone/mL of Ropivacaine The unused portion of the solution was discarded in the proper designated containers.  Once the entire procedure was completed, the treated area  was cleaned, making sure to leave some of the prepping solution back to take advantage of its long term bactericidal properties.   Illustration of the posterior view of the lumbar spine and the posterior neural structures. Laminae of L2 through  S1 are labeled. DPRL5, dorsal primary ramus of L5; DPRS1, dorsal primary ramus of S1; DPR3, dorsal primary ramus of L3; FJ, facet (zygapophyseal) joint L3-L4; I, inferior articular process of L4; LB1, lateral branch of dorsal primary ramus of L1; IAB, inferior articular branches from L3 medial branch (supplies L4-L5 facet joint); IBP, intermediate branch plexus; MB3, medial branch of dorsal primary ramus of L3; NR3, third lumbar nerve root; S, superior articular process of L5; SAB, superior articular branches from L4 (supplies L4-5 facet joint also); TP3, transverse process of L3.  Vitals:   03/07/18 1355 03/07/18 1401 03/07/18 1407 03/07/18 1415  BP: (!) 117/100 (!) 117/100 132/83 130/88  Pulse:      Resp: (!) 8 11 10 20   Temp:      TempSrc:      SpO2: 100% 100% 97% 100%  Weight:      Height:        Start Time: 1348 hrs. End Time: 1404 hrs.  Imaging Guidance (Spinal):          Type of Imaging Technique: Fluoroscopy Guidance (Spinal) Indication(s): Assistance in needle guidance and placement for procedures requiring needle placement in or near specific anatomical locations not easily accessible without such assistance. Exposure Time: Please see nurses notes. Contrast: None used. Fluoroscopic Guidance: I was personally present during the use of fluoroscopy. "Tunnel Vision Technique" used to obtain the best possible view of the target area. Parallax error corrected before commencing the procedure. "Direction-depth-direction" technique used to introduce the needle under continuous pulsed fluoroscopy. Once target was reached, antero-posterior, oblique, and lateral fluoroscopic projection used confirm needle placement in all planes. Images permanently  stored in EMR. Interpretation: No contrast injected. I personally interpreted the imaging intraoperatively. Adequate needle placement confirmed in multiple planes. Permanent images saved into the patient's record.  Antibiotic Prophylaxis:   Anti-infectives (From admission, onward)   None     Indication(s): None identified  Post-operative Assessment:  Post-procedure Vital Signs:  Pulse/HCG Rate: 60(!) 58 Temp: 98.3 F (36.8 C) Resp: 20 BP: 130/88 SpO2: 100 %  EBL: None  Complications: No immediate post-treatment complications observed by team, or reported by patient.  Note: The patient tolerated the entire procedure well. A repeat set of vitals were taken after the procedure and the patient was kept under observation following institutional policy, for this type of procedure. Post-procedural neurological assessment was performed, showing return to baseline, prior to discharge. The patient was provided with post-procedure discharge instructions, including a section on how to identify potential problems. Should any problems arise concerning this procedure, the patient was given instructions to immediately contact us, at any time, without hesitation. In any case, we plan to contact the patient by telephone for a follow-up status report regarding this interventional procedure.  Comments:  No additional relevant information.  Plan of Care    Imaging Orders     DG C-Arm 1-60 Min-No Report  Procedure Orders     LUMBAR FACET(MEDIAL BRANCH NERVE BLOCK) MBNB  Medications ordered for procedure: Meds ordered this encounter  Medications  . lidocaine (XYLOCAINE) 2 % (with pres) injection 400 mg  . midazolam (VERSED) 5 MG/5ML injection 1-2 mg    Make sure Flumazenil is available in the pyxis when using this medication. If oversedation occurs, administer 0.2 mg IV over 15 sec. If after 45 sec no response, administer 0.2 mg again over 1 min; may repeat at 1 min intervals; not to exceed 4  doses (1 mg)  . fentaNYL (SUBLIMAZE) injection 25-50  mcg    Make sure Narcan is available in the pyxis when using this medication. In the event of respiratory depression (RR< 8/min): Titrate NARCAN (naloxone) in increments of 0.1 to 0.2 mg IV at 2-3 minute intervals, until desired degree of reversal.  . lactated ringers infusion 1,000 mL  . ropivacaine (PF) 2 mg/mL (0.2%) (NAROPIN) injection 18 mL  . triamcinolone acetonide (KENALOG-40) injection 80 mg   Medications administered: We administered lidocaine, midazolam, fentaNYL, lactated ringers, ropivacaine (PF) 2 mg/mL (0.2%), and triamcinolone acetonide.  See the medical record for exact dosing, route, and time of administration.  New Prescriptions   No medications on file   Disposition: Discharge home  Discharge Date & Time: 03/07/2018; 1440 hrs.   Physician-requested Follow-up: Return for post-procedure eval (2 wks), w/ Dr. Laban Emperor.  Future Appointments  Date Time Provider Department Center  03/27/2018  9:15 AM Delano Metz, MD [facility] None   Primary Care Physician: Darvin Neighbours Location: [facility] Outpatient Pain Management Facility Note by: Oswaldo Done, MD Date: 03/07/2018; Time: 2:27 PM  Disclaimer:  Medicine is not an Visual merchandiser. The only guarantee in medicine is that nothing is guaranteed. It is important to note that the decision to proceed with this intervention was based on the information collected from the patient. The Data and conclusions were drawn from the patient's questionnaire, the interview, and the physical examination. Because the information was provided in large part by the patient, it cannot be guaranteed that it has not been purposely or unconsciously manipulated. Every effort has been made to obtain as much relevant data as possible for this evaluation. It is important to note that the conclusions that lead to this procedure are derived in large part from the available data. Always take into  account that the treatment will also be dependent on availability of resources and existing treatment guidelines, considered by other Pain Management Practitioners as being common knowledge and practice, at the time of the intervention. For Medico-Legal purposes, it is also important to point out that variation in procedural techniques and pharmacological choices are the acceptable norm. The indications, contraindications, technique, and results of the above procedure should only be interpreted and judged by a Board-Certified Interventional Pain Specialist with extensive familiarity and expertise in the same exact procedure and technique.

## 2018-03-08 ENCOUNTER — Telehealth: Payer: Self-pay | Admitting: *Deleted

## 2018-03-08 NOTE — Telephone Encounter (Signed)
Attempted to call for post procedure follow-up. Message left. 

## 2018-03-11 ENCOUNTER — Encounter: Payer: Self-pay | Admitting: Pain Medicine

## 2018-03-26 NOTE — Progress Notes (Signed)
Patient's Name: Holly Lopez  MRN: [id number]  Referring Provider: Orlena Sheldon, MD  DOB: 1990-12-03  PCP: Orlena Sheldon  DOS: 03/27/2018  Note by: Gaspar Cola, MD  Service setting: Ambulatory outpatient  Specialty: Interventional Pain Management  Location: [facility] (AMB) Pain Management Facility    Patient type: Established   Primary Reason(s) for Visit: Encounter for post-procedure evaluation of chronic illness with mild to moderate exacerbation CC: Back Pain (low)  HPI  [name] is a 27 y.o. year old, female patient, who comes today for a post-procedure evaluation. She has Abdominal pain; B12 deficiency; Depression; Primary fibromyalgia syndrome; History of pernicious anemia; Hyperlipidemia; Hypothyroidism; Joint pain; Malaise and fatigue; Morbid obesity ([place]); S/P laparoscopic sleeve gastrectomy; Symptomatic cholelithiasis; Chronic hip pain (Bilateral); Chronic sacroiliac joint pain (Bilateral); Chronic lower extremity pain (Secondary Area of Pain) (Bilateral); Chronic neck pain (Tertiary Area of Pain) (Bilateral) (R>L); Chronic pain syndrome; Long term current use of opiate analgesic; Pharmacologic therapy; Disorder of skeletal system; Problems influencing health status; Fibromyalgia; Chronic musculoskeletal pain; Chronic low back pain (Primary Area of Pain) (Bilateral) w/o sciatica; Cervicalgia; Cervical facet syndrome (Bilateral) (R>L); Lumbar facet syndrome (Bilateral) (R>L); and Spondylosis without myelopathy or radiculopathy, lumbosacral region on their problem list. Her primarily concern today is the Back Pain (low)  Pain Assessment: Location: Lower Back Radiating: denies Onset: More than a month ago Duration: Chronic pain Quality: Constant, Aching(deep) Severity: 2 /10 (subjective, self-reported pain score)  Note: Reported level is compatible with observation.                               Timing: Constant Modifying factors: medications, procedures, stretching BP: 140/89   HR: 85  [name] comes in today for post-procedure evaluation after the treatment done on 03/08/2018. Had PT before w/ [facility], Last year.   Further details on both, my assessment(s), as well as the proposed treatment plan, please see below.  Post-Procedure Assessment  03/07/2018 Procedure: Diagnostic bilateral lumbar facet block #1 under fluoroscopic guidance and IV sedation Pre-procedure pain score:  5/10 Post-procedure pain score: 0/10 (100% relief) Influential Factors: BMI: 33.12 kg/m Intra-procedural challenges: None observed.         Assessment challenges: None detected.              Reported side-effects: None.        Post-procedural adverse reactions or complications: None reported         Sedation: Sedation provided. When no sedatives are used, the analgesic levels obtained are directly associated to the effectiveness of the local anesthetics. However, when sedation is provided, the level of analgesia obtained during the initial 1 hour following the intervention, is believed to be the result of a combination of factors. These factors may include, but are not limited to: 1. The effectiveness of the local anesthetics used. 2. The effects of the analgesic(s) and/or anxiolytic(s) used. 3. The degree of discomfort experienced by the patient at the time of the procedure. 4. The patients ability and reliability in recalling and recording the events. 5. The presence and influence of possible secondary gains and/or psychosocial factors. Reported result: Relief experienced during the 1st hour after the procedure: 100 % (Ultra-Short Term Relief)            Interpretative annotation: Clinically appropriate result. Analgesia during this period is likely to be Local Anesthetic and/or IV Sedative (Analgesic/Anxiolytic) related.  Effects of local anesthetic: The analgesic effects attained during this period are directly associated to the localized infiltration of local anesthetics and  therefore cary significant diagnostic value as to the etiological location, or anatomical origin, of the pain. Expected duration of relief is directly dependent on the pharmacodynamics of the local anesthetic used. Long-acting (4-6 hours) anesthetics used.  Reported result: Relief during the next 4 to 6 hour after the procedure: 90 % (Short-Term Relief)            Interpretative annotation: Clinically appropriate result. Analgesia during this period is likely to be Local Anesthetic-related.          Long-term benefit: Defined as the period of time past the expected duration of local anesthetics (1 hour for short-acting and 4-6 hours for long-acting). With the possible exception of prolonged sympathetic blockade from the local anesthetics, benefits during this period are typically attributed to, or associated with, other factors such as analgesic sensory neuropraxia, antiinflammatory effects, or beneficial biochemical changes provided by agents other than the local anesthetics.  Reported result: Extended relief following procedure: 50 % (Long-Term Relief)            Interpretative annotation: Clinically possible results. Good relief. No permanent benefit expected. Inflammation plays a part in the etiology to the pain.          Current benefits: Defined as reported results that persistent at this point in time.   Analgesia: 25-50 %            Function: [name] reports improvement in function ROM: [name] reports improvement in ROM Interpretative annotation: Recurrence of symptoms. No permanent benefit expected. Effective diagnostic intervention.          Interpretation: Results would suggest a successful diagnostic intervention.                  Plan:  Consider diagnostic procedure No.: 2          Laboratory Chemistry  Inflammation Markers (CRP: Acute Phase) (ESR: Chronic Phase) Lab Results  Component Value Date   CRP 9 01/31/2018   ESRSEDRATE 6 01/31/2018                         Renal  Markers Lab Results  Component Value Date   BUN 11 01/31/2018   CREATININE 0.95 01/31/2018   BCR 12 01/31/2018   GFRAA 94 01/31/2018   GFRNONAA 81 01/31/2018                             Hepatic Markers Lab Results  Component Value Date   AST 17 01/31/2018   ALBUMIN 4.4 01/31/2018                        Neuropathy Markers Lab Results  Component Value Date   VITAMINB12 917 01/31/2018                        Note: Lab results reviewed.  Recent Imaging Results   Results for orders placed in visit on 03/07/18  DG C-Arm 1-60 Min-No Report   Narrative Fluoroscopy was utilized by the requesting physician.  No radiographic  interpretation.    Interpretation Report: Fluoroscopy was used during the procedure to assist with needle guidance. The images were interpreted intraoperatively by the requesting physician.  Meds   Current Outpatient Medications:  .  cholecalciferol (VITAMIN D) 1000 units tablet, Take 1,000 Units by mouth daily., Disp: , Rfl:  .  Cyanocobalamin (B-12 COMPLIANCE INJECTION) 1000 MCG/ML KIT, Inject as directed., Disp: , Rfl:  .  cyclobenzaprine (FLEXERIL) 5 MG tablet, Take by mouth., Disp: , Rfl:  .  doxylamine, Sleep, (UNISOM) 25 MG tablet, Take 25 mg by mouth at bedtime as needed., Disp: , Rfl:  .  folic acid-pyridoxine-cyancobalamin (FOLTX) 2.5-25-2 MG TABS tablet, Take 1 tablet by mouth daily., Disp: , Rfl:  .  levonorgestrel-ethinyl estradiol (ENPRESSE,TRIVORA) tablet, Take 1 tablet by mouth daily., Disp: , Rfl:  .  levothyroxine (SYNTHROID, LEVOTHROID) 25 MCG tablet, levothyroxine 25 mcg tablet, Disp: , Rfl:  .  Melatonin 1 MG CAPS, Take by mouth., Disp: , Rfl:  .  Multiple Vitamin (MULTIVITAMIN) capsule, Take 1 capsule by mouth daily., Disp: , Rfl:  .  traMADol (ULTRAM) 50 MG tablet, Take by mouth every 6 (six) hours as needed., Disp: , Rfl:  .  UNABLE TO FIND, Take 5 g by mouth as needed., Disp: , Rfl:   ROS  Constitutional: Denies any fever or  chills Gastrointestinal: No reported hemesis, hematochezia, vomiting, or acute GI distress Musculoskeletal: Denies any acute onset joint swelling, redness, loss of ROM, or weakness Neurological: No reported episodes of acute onset apraxia, aphasia, dysarthria, agnosia, amnesia, paralysis, loss of coordination, or loss of consciousness  Allergies  Ms. Lorek is allergic to nsaids and doxycycline.  PFSH  Drug: Ms. Rhee  has no drug history on file. Alcohol:  has no alcohol history on file. Tobacco:  reports that she has never smoked. She has never used smokeless tobacco. Medical:  has a past medical history of Allergy, Fibromyalgia, Thyroid disease, and Vitamin B12 deficiency (non anemic). Surgical: Ms. Tellefsen  has a past surgical history that includes gallbadder and Laparoscopic gastric sleeve resection. Family: family history includes Diabetes in her brother; Heart disease in her brother; Hypertension in her brother and father; Thyroid disease in her mother.  Constitutional Exam  General appearance: Well nourished, well developed, and well hydrated. In no apparent acute distress Vitals:   03/27/18 0917  BP: 140/89  Pulse: 85  Resp: 18  Temp: 98.3 F (36.8 C)  SpO2: 100%  Weight: 199 lb (90.3 kg)  Height: _0  (1.651 m)   BMI Assessment: Estimated body mass index is 33.12 kg/m as calculated from the following:   Height as of this encounter: _1  (1.651 m).   Weight as of this encounter: 199 lb (90.3 kg).  BMI interpretation table: BMI level Category Range association with higher incidence of chronic pain  <18 kg/m2 Underweight   18.5-24.9 kg/m2 Ideal body weight   25-29.9 kg/m2 Overweight Increased incidence by 20%  30-34.9 kg/m2 Obese (Class I) Increased incidence by 68%  35-39.9 kg/m2 Severe obesity (Class II) Increased incidence by 136%  >40 kg/m2 Extreme obesity (Class III) Increased incidence by 254%   Patient's current BMI Ideal Body weight  Body mass index is 33.12  kg/m. Ideal body weight: 57 kg (125 lb 10.6 oz) Adjusted ideal body weight: 70.3 kg (155 lb)   BMI Readings from Last 4 Encounters:  03/27/18 33.12 kg/m  03/07/18 32.95 kg/m  03/01/18 33.28 kg/m  01/31/18 32.28 kg/m   Wt Readings from Last 4 Encounters:  03/27/18 199 lb (90.3 kg)  03/07/18 198 lb (89.8 kg)  03/01/18 200 lb (90.7 kg)  01/31/18 194 lb (88 kg)  Psych/Mental status: Alert, oriented x 3 (person, place, & time)  Eyes: PERLA Respiratory: No evidence of acute respiratory distress  Cervical Spine Area Exam  Skin & Axial Inspection: No masses, redness, edema, swelling, or associated skin lesions Alignment: Symmetrical Functional ROM: Unrestricted ROM      Stability: No instability detected Muscle Tone/Strength: Functionally intact. No obvious neuro-muscular anomalies detected. Sensory (Neurological): Unimpaired Palpation: No palpable anomalies              Upper Extremity (UE) Exam    Side: Right upper extremity  Side: Left upper extremity  Skin & Extremity Inspection: Skin color, temperature, and hair growth are WNL. No peripheral edema or cyanosis. No masses, redness, swelling, asymmetry, or associated skin lesions. No contractures.  Skin & Extremity Inspection: Skin color, temperature, and hair growth are WNL. No peripheral edema or cyanosis. No masses, redness, swelling, asymmetry, or associated skin lesions. No contractures.  Functional ROM: Unrestricted ROM          Functional ROM: Unrestricted ROM          Muscle Tone/Strength: Functionally intact. No obvious neuro-muscular anomalies detected.  Muscle Tone/Strength: Functionally intact. No obvious neuro-muscular anomalies detected.  Sensory (Neurological): Unimpaired          Sensory (Neurological): Unimpaired          Palpation: No palpable anomalies              Palpation: No palpable anomalies              Provocative Test(s):  Phalen's test: deferred Tinel's test: deferred Apley's scratch test (touch  opposite shoulder):  Action 1 (Across chest): deferred Action 2 (Overhead): deferred Action 3 (LB reach): deferred   Provocative Test(s):  Phalen's test: deferred Tinel's test: deferred Apley's scratch test (touch opposite shoulder):  Action 1 (Across chest): deferred Action 2 (Overhead): deferred Action 3 (LB reach): deferred    Thoracic Spine Area Exam  Skin & Axial Inspection: No masses, redness, or swelling Alignment: Symmetrical Functional ROM: Unrestricted ROM Stability: No instability detected Muscle Tone/Strength: Functionally intact. No obvious neuro-muscular anomalies detected. Sensory (Neurological): Unimpaired Muscle strength & Tone: No palpable anomalies  Lumbar Spine Area Exam  Skin & Axial Inspection: No masses, redness, or swelling Alignment: Symmetrical Functional ROM: Decreased ROM       Stability: No instability detected Muscle Tone/Strength: Functionally intact. No obvious neuro-muscular anomalies detected. Sensory (Neurological): Movement-associated pain Palpation: Complains of area being tender to palpation       Provocative Tests: Hyperextension/rotation test: (+) bilaterally for facet joint pain. Lumbar quadrant test (Kemp's test): (+) bilaterally for facet joint pain. Lateral bending test: deferred today       Patrick's Maneuver: deferred today                   FABER test: deferred today                   S-I anterior distraction/compression test: deferred today         S-I lateral compression test: deferred today         S-I Thigh-thrust test: deferred today         S-I Gaenslen's test: deferred today          Gait & Posture Assessment  Ambulation: Unassisted Gait: Relatively normal for age and body habitus Posture: Difficulty standing up straight, due to pain   Lower Extremity Exam    Side: Right lower extremity  Side: Left lower extremity  Stability: No instability observed  Stability: No instability observed          Skin &  Extremity Inspection: Skin color, temperature, and hair growth are WNL. No peripheral edema or cyanosis. No masses, redness, swelling, asymmetry, or associated skin lesions. No contractures.  Skin & Extremity Inspection: Skin color, temperature, and hair growth are WNL. No peripheral edema or cyanosis. No masses, redness, swelling, asymmetry, or associated skin lesions. No contractures.  Functional ROM: Unrestricted ROM                  Functional ROM: Unrestricted ROM                  Muscle Tone/Strength: Functionally intact. No obvious neuro-muscular anomalies detected.  Muscle Tone/Strength: Functionally intact. No obvious neuro-muscular anomalies detected.  Sensory (Neurological): Unimpaired  Sensory (Neurological): Unimpaired  Palpation: No palpable anomalies  Palpation: No palpable anomalies   Assessment  Primary Diagnosis & Pertinent Problem List: The primary encounter diagnosis was Chronic low back pain (Primary Area of Pain) (Bilateral) w/o sciatica. Diagnoses of Chronic lower extremity pain (Secondary Area of Pain) (Bilateral), Lumbar facet syndrome (Bilateral) (R>L), and Spondylosis without myelopathy or radiculopathy, lumbosacral region were also pertinent to this visit.  Status Diagnosis  Improving Controlled Improved 1. Chronic low back pain (Primary Area of Pain) (Bilateral) w/o sciatica   2. Chronic lower extremity pain (Secondary Area of Pain) (Bilateral)   3. Lumbar facet syndrome (Bilateral) (R>L)   4. Spondylosis without myelopathy or radiculopathy, lumbosacral region     Problems updated and reviewed during this visit: Problem  Lumbar facet syndrome (Bilateral) (R>L)   Plan of Care  Pharmacotherapy (Medications Ordered): No orders of the defined types were placed in this encounter.  Medications administered today: Yareth Gangi had no medications administered during this visit.   Procedure Orders     LUMBAR FACET(MEDIAL BRANCH NERVE BLOCK) MBNB     LUMBAR  FACET(MEDIAL BRANCH NERVE BLOCK) MBNB     Radiofrequency,Lumbar Lab Orders  No laboratory test(s) ordered today   Imaging Orders  No imaging studies ordered today    Referral Orders     Ambulatory referral to Physical Therapy  Interventional management options: Planned, scheduled, and/or pending:   Diagnostic bilateral lumbar facet block #2 under fluoroscopic guidance and IV sedation   Considering:   Diagnostic trigger point injections  Diagnostic bilateral sacroiliac joint injections  Possible bilateral sacroiliac joint RFA  Diagnostic bilateral lumbar facet block  Possible bilateral lumbar facet RFA  Diagnostic bilateral intra-articular hip joint injection  Diagnostic bilateral femoral nerve + obturator nerve block  Possible bilateral femoral nerve + obturator nerve RFA  Diagnostic L4-5 interlaminar LESI  Diagnostic right-sided Cervical ESI  Diagnostic bilateral cervical facet block  Possible bilateral cervical facet RFA    Palliative PRN treatment(s):   None at this time   Provider-requested follow-up: Return for Procedure (w/ sedation): (B) L-FCT BLK #2.  Future Appointments  Date Time Provider [place]  04/11/2018  9:45 AM Milinda Pointer, MD [facility] None   Primary Care Physician: Orlena Sheldon Location: [facility] Note by: Gaspar Cola, MD Date: 03/27/2018; Time: 10:36 AM

## 2018-03-27 ENCOUNTER — Encounter: Payer: Self-pay | Admitting: Pain Medicine

## 2018-03-27 ENCOUNTER — Ambulatory Visit: Payer: BLUE CROSS/BLUE SHIELD | Attending: Pain Medicine | Admitting: Pain Medicine

## 2018-03-27 ENCOUNTER — Other Ambulatory Visit: Payer: Self-pay

## 2018-03-27 VITALS — BP 140/89 | HR 85 | Temp 98.3°F | Resp 18 | Ht 65.0 in | Wt 199.0 lb

## 2018-03-27 DIAGNOSIS — M47817 Spondylosis without myelopathy or radiculopathy, lumbosacral region: Secondary | ICD-10-CM | POA: Diagnosis not present

## 2018-03-27 DIAGNOSIS — F329 Major depressive disorder, single episode, unspecified: Secondary | ICD-10-CM | POA: Insufficient documentation

## 2018-03-27 DIAGNOSIS — Z6833 Body mass index (BMI) 33.0-33.9, adult: Secondary | ICD-10-CM | POA: Insufficient documentation

## 2018-03-27 DIAGNOSIS — Z79899 Other long term (current) drug therapy: Secondary | ICD-10-CM | POA: Insufficient documentation

## 2018-03-27 DIAGNOSIS — E039 Hypothyroidism, unspecified: Secondary | ICD-10-CM | POA: Diagnosis not present

## 2018-03-27 DIAGNOSIS — Z886 Allergy status to analgesic agent status: Secondary | ICD-10-CM | POA: Diagnosis not present

## 2018-03-27 DIAGNOSIS — G894 Chronic pain syndrome: Secondary | ICD-10-CM | POA: Insufficient documentation

## 2018-03-27 DIAGNOSIS — E538 Deficiency of other specified B group vitamins: Secondary | ICD-10-CM | POA: Diagnosis not present

## 2018-03-27 DIAGNOSIS — E785 Hyperlipidemia, unspecified: Secondary | ICD-10-CM | POA: Diagnosis not present

## 2018-03-27 DIAGNOSIS — Z79891 Long term (current) use of opiate analgesic: Secondary | ICD-10-CM | POA: Insufficient documentation

## 2018-03-27 DIAGNOSIS — M47816 Spondylosis without myelopathy or radiculopathy, lumbar region: Secondary | ICD-10-CM | POA: Diagnosis not present

## 2018-03-27 DIAGNOSIS — Z881 Allergy status to other antibiotic agents status: Secondary | ICD-10-CM | POA: Diagnosis not present

## 2018-03-27 DIAGNOSIS — M79604 Pain in right leg: Secondary | ICD-10-CM

## 2018-03-27 DIAGNOSIS — M533 Sacrococcygeal disorders, not elsewhere classified: Secondary | ICD-10-CM | POA: Insufficient documentation

## 2018-03-27 DIAGNOSIS — M79605 Pain in left leg: Secondary | ICD-10-CM

## 2018-03-27 DIAGNOSIS — G8929 Other chronic pain: Secondary | ICD-10-CM

## 2018-03-27 DIAGNOSIS — M545 Low back pain: Secondary | ICD-10-CM

## 2018-03-27 NOTE — Patient Instructions (Addendum)
____________________________________________________________________________________________  Preparing for Procedure with Sedation  Instructions: . Oral Intake: Do not eat or drink anything for at least 8 hours prior to your procedure. . Transportation: Public transportation is not allowed. Bring an adult driver. The driver must be physically present in our waiting room before any procedure can be started. . Physical Assistance: Bring an adult physically capable of assisting you, in the event you need help. This adult should keep you company at home for at least 6 hours after the procedure. . Blood Pressure Medicine: Take your blood pressure medicine with a sip of water the morning of the procedure. . Blood thinners: Notify our staff if you are taking any blood thinners. Depending on which one you take, there will be specific instructions on how and when to stop it. . Diabetics on insulin: Notify the staff so that you can be scheduled 1st case in the morning. If your diabetes requires high dose insulin, take only  of your normal insulin dose the morning of the procedure and notify the staff that you have done so. . Preventing infections: Shower with an antibacterial soap the morning of your procedure. . Build-up your immune system: Take 1000 mg of Vitamin C with every meal (3 times a day) the day prior to your procedure. . Antibiotics: Inform the staff if you have a condition or reason that requires you to take antibiotics before dental procedures. . Pregnancy: If you are pregnant, call and cancel the procedure. . Sickness: If you have a cold, fever, or any active infections, call and cancel the procedure. . Arrival: You must be in the facility at least 30 minutes prior to your scheduled procedure. . Children: Do not bring children with you. . Dress appropriately: Bring dark clothing that you would not mind if they get stained. . Valuables: Do not bring any jewelry or valuables.  Procedure  appointments are reserved for interventional treatments only. . No Prescription Refills. . No medication changes will be discussed during procedure appointments. . No disability issues will be discussed.  Reasons to call and reschedule or cancel your procedure: (Following these recommendations will minimize the risk of a serious complication.) . Surgeries: Avoid having procedures within 2 weeks of any surgery. (Avoid for 2 weeks before or after any surgery). . Flu Shots: Avoid having procedures within 2 weeks of a flu shots or . (Avoid for 2 weeks before or after immunizations). . Barium: Avoid having a procedure within 7-10 days after having had a radiological study involving the use of radiological contrast. (Myelograms, Barium swallow or enema study). . Heart attacks: Avoid any elective procedures or surgeries for the initial 6 months after a "Myocardial Infarction" (Heart Attack). . Blood thinners: It is imperative that you stop these medications before procedures. Let us know if you if you take any blood thinner.  . Infection: Avoid procedures during or within two weeks of an infection (including chest colds or gastrointestinal problems). Symptoms associated with infections include: Localized redness, fever, chills, night sweats or profuse sweating, burning sensation when voiding, cough, congestion, stuffiness, runny nose, sore throat, diarrhea, nausea, vomiting, cold or Flu symptoms, recent or current infections. It is specially important if the infection is over the area that we intend to treat. . Heart and lung problems: Symptoms that may suggest an active cardiopulmonary problem include: cough, chest pain, breathing difficulties or shortness of breath, dizziness, ankle swelling, uncontrolled high or unusually low blood pressure, and/or palpitations. If you are experiencing any of these symptoms, cancel   your procedure and contact your primary care physician for an evaluation.  Remember:   Regular Business hours are:  Monday to Thursday 8:00 AM to 4:00 PM  Provider's Schedule: Delano Metz, MD:  Procedure days: Tuesday and Thursday 7:30 AM to 4:00 PM  Edward Jolly, MD:  Procedure days: Monday and Wednesday 7:30 AM to 4:00 PM ____________________________________________________________________________________________   Preparing for Procedure with Sedation Instructions: . Oral Intake: Do not eat or drink anything for at least 8 hours prior to your procedure. . Transportation: Public transportation is not allowed. Bring an adult driver. The driver must be physically present in our waiting room before any procedure can be started. Marland Kitchen Physical Assistance: Bring an adult capable of physically assisting you, in the event you need help. . Blood Pressure Medicine: Take your blood pressure medicine with a sip of water the morning of the procedure. . Insulin: Take only  of your normal insulin dose. . Preventing infections: Shower with an antibacterial soap the morning of your procedure. . Build-up your immune system: Take 1000 mg of Vitamin C with every meal (3 times a day) the day prior to your procedure. . Pregnancy: If you are pregnant, call and cancel the procedure. . Sickness: If you have a cold, fever, or any active infections, call and cancel the procedure. . Arrival: You must be in the facility at least 30 minutes prior to your scheduled procedure. . Children: Do not bring children with you. . Dress appropriately: Bring dark clothing that you would not mind if they get stained. . Valuables: Do not bring any jewelry or valuables. Procedure appointments are reserved for interventional treatments only. Marland Kitchen No Prescription Refills. . No medication changes will be discussed during procedure appointments. No disability issues will be discussed.Post-procedure Information What to expect: Most procedures involve the use of a local anesthetic (numbing medicine), and a steroid  (anti-inflammatory medicine).  The local anesthetics may cause temporary numbness and weakness of the legs or arms, depending on the location of the block. This numbness/weakness may last 4-6 hours, depending on the local anesthetic used. In rare instances, it can last up to 24 hours. While numb, you must be very careful not to injure the extremity.  After any procedure, you could expect the pain to get better within 15-20 minutes. This relief is temporary and may last 4-6 hours. Once the local anesthetics wears off, you could experience discomfort, possibly more than usual, for up to 10 (ten) days. In the case of radiofrequencies, it may last up to 6 weeks. Surgeries may take up to 8 weeks for the healing process. The discomfort is due to the irritation caused by needles going through skin and muscle. To minimize the discomfort, we recommend using ice the first day, and heat from then on. The ice should be applied for 15 minutes on, and 15 minutes off. Keep repeating this cycle until bedtime. Avoid applying the ice directly to the skin, to prevent frostbite. Heat should be used daily, until the pain improves (4-10 days). Be careful not to burn yourself.  Occasionally you may experience muscle spasms or cramps. These occur as a consequence of the irritation caused by the needle sticks to the muscle and the blood that will inevitably be lost into the surrounding muscle tissue. Blood tends to be very irritating to tissues, which tend to react by going into spasm. These spasms may start the same day of your procedure, but they may also take days to develop. This late onset type  of spasm or cramp is usually caused by electrolyte imbalances triggered by the steroids, at the level of the kidney. Cramps and spasms tend to respond well to muscle relaxants, multivitamins (some are triggered by the procedure, but may have their origins in vitamin deficiencies), and "Gatorade", or any sports drinks that can replenish any  electrolyte imbalances. (If you are a diabetic, ask your pharmacist to get you a sugar-free brand.) Warm showers or baths may also be helpful. Stretching exercises are highly recommended. General Instructions:  Be alert for signs of possible infection: redness, swelling, heat, red streaks, elevated temperature, and/or fever. These typically appear 4 to 6 days after the procedure. Immediately notify your doctor if you experience unusual bleeding, difficulty breathing, or loss of bowel or bladder control. If you experience increased pain, do not increase your pain medicine intake, unless instructed by your pain physician. Post-Procedure Care:  Be careful in moving about. Muscle spasms in the area of the injection may occur. Applying ice or heat to the area is often helpful. The incidence of spinal headaches after epidural injections ranges between 1.4% and 6%. If you develop a headache that does not seem to respond to conservative therapy, please let your physician know. This can be treated with an epidural blood patch.   Post-procedure numbness or redness is to be expected, however it should average 4 to 6 hours. If numbness and weakness of your extremities begins to develop 4 to 6 hours after your procedure, and is felt to be progressing and worsening, immediately contact your physician.   Diet:  If you experience nausea, do not eat until this sensation goes away. If you had a "Stellate Ganglion Block" for upper extremity "Reflex Sympathetic Dystrophy", do not eat or drink until your hoarseness goes away. In any case, always start with liquids first and if you tolerate them well, then slowly progress to more solid foods. Activity:  For the first 4 to 6 hours after the procedure, use caution in moving about as you may experience numbness and/or weakness. Use caution in cooking, using household electrical appliances, and climbing steps. If you need to reach your Doctor call our office: [phone]  [phone] Monday-Thursday 8:00 am - 4:00 PM    Fridays: Closed     In case of an emergency: In case of emergency, call 911 or go to the nearest emergency room and have the physician there call us.  Interpretation of Procedure Every nerve block has two components: a diagnostic component, and a treatment component. Unrealistic expectations are the most common causes of "perceived failure".  In a perfect world, a single nerve block should be able to completely and permanently eliminate the pain. Sadly, the world is not perfect.  Most pain management nerve blocks are performed using local anesthetics and steroids. Steroids are responsible for any long-term benefit that you may experience. Their purpose is to decrease any chronic swelling that may exist in the area. Steroids begin to work immediately after being injected. However, most patients will not experience any benefits until 5 to 10 days after the injection, when the swelling has come down to the point where they can tell a difference. Steroids will only help if there is swelling to be treated. As such, they can assist with the diagnosis. If effective, they suggest an inflammatory component to the pain, and if ineffective, they rule out inflammation as the main cause or component of the problem. If the problem is one of mechanical compression, you will get no  benefit from those steroids.   In the case of local anesthetics, they have a crucial role in the diagnosis of your condition. Most will begin to work within15 to 20 minutes after injection. The duration will depend on the type used (short- vs. Long-acting). It is of outmost importance that patients keep tract of their pain, after the procedure. To assist with this matter, a "Post-procedure Pain Diary" is provided. Make sure to complete it and to bring it back to your follow-up appointment.  As long as the patient keeps accurate, detailed records of their symptoms after every procedure, and  returns to have those interpreted, every procedure will provide [place] with invaluable information. Even a block that does not provide the patient with any relief, will always provide [place] with information about the mechanism and the origin of the pain. The only time a nerve block can be considered a waste of time is when patients do not keep track of the results, or do not keep their post-procedure appointment.  Reporting the results back to your physician The Pain Score  Pain is a subjective complaint. It cannot be seen, touched, or measured. We depend entirely on the patient's report of the pain in order to assess your condition and treatment. To evaluate the pain, we use a pain scale, where "0" means "No Pain", and a "10" is "the worst possible pain that you can even imagine" (i.e. something like been eaten alive by a shark or being torn apart by a lion).   You will frequently be asked to rate your pain. Please be as accurate, remember that medical decisions will be based on your responses. Please do not rate your pain above a 10. Doing so is actually interpreted as "symptom magnification" (exaggeration), as well as lack of understanding with regards to the scale. To put this into perspective, when you tell us that your pain is at a 10 (ten), what you are saying is that there is nothing we can do to make this pain any worse. (Carefully think about that.)Radiofrequency Lesioning Radiofrequency lesioning is a procedure that is performed to relieve pain. The procedure is often used for back, neck, or arm pain. Radiofrequency lesioning involves the use of a machine that creates radio waves to make heat. During the procedure, the heat is applied to the nerve that carries the pain signal. The heat damages the nerve and interferes with the pain signal. Pain relief usually starts about 2 weeks after the procedure and lasts for 6 months to 1 year. Tell a health care provider about: Any allergies you have. All  medicines you are taking, including vitamins, herbs, eye drops, creams, and over-the-counter medicines. Any problems you or family members have had with anesthetic medicines. Any blood disorders you have. Any surgeries you have had. Any medical conditions you have. Whether you are pregnant or may be pregnant. What are the risks? Generally, this is a safe procedure. However, problems may occur, including: Pain or soreness at the injection site. Infection at the injection site. Damage to nerves or blood vessels.  What happens before the procedure? Ask your health care provider about: Changing or stopping your regular medicines. This is especially important if you are taking diabetes medicines or blood thinners. Taking medicines such as aspirin and ibuprofen. These medicines can thin your blood. Do not take these medicines before your procedure if your health care provider instructs you not to. Follow instructions from your health care provider about eating or drinking restrictions. Plan to  have someone take you home after the procedure. If you go home right after the procedure, plan to have someone with you for 24 hours. What happens during the procedure? You will be given one or more of the following: A medicine to help you relax (sedative). A medicine to numb the area (local anesthetic). You will be awake during the procedure. You will need to be able to talk with the health care provider during the procedure. With the help of a type of X-ray (fluoroscopy), the health care provider will insert a radiofrequency needle into the area to be treated. Next, a wire that carries the radio waves (electrode) will be put through the radiofrequency needle. An electrical pulse will be sent through the electrode to verify the correct nerve. You will feel a tingling sensation, and you may have muscle twitching. Then, the tissue that is around the needle tip will be heated by an electric current that is  passed using the radiofrequency machine. This will numb the nerves. A bandage (dressing) will be put on the insertion area after the procedure is done. The procedure may vary among health care providers and hospitals. What happens after the procedure? Your blood pressure, heart rate, breathing rate, and blood oxygen level will be monitored often until the medicines you were given have worn off. Return to your normal activities as directed by your health care provider. This information is not intended to replace advice given to you by your health care provider. Make sure you discuss any questions you have with your health care provider. Document Released: 02/24/2011 Document Revised: 12/04/2015 Document Reviewed: 08/05/2014 [employer] Interactive Patient Education  [employer]. .

## 2018-04-11 ENCOUNTER — Ambulatory Visit (HOSPITAL_BASED_OUTPATIENT_CLINIC_OR_DEPARTMENT_OTHER): Payer: BLUE CROSS/BLUE SHIELD | Admitting: Pain Medicine

## 2018-04-11 ENCOUNTER — Other Ambulatory Visit: Payer: Self-pay

## 2018-04-11 ENCOUNTER — Encounter: Payer: Self-pay | Admitting: Pain Medicine

## 2018-04-11 ENCOUNTER — Ambulatory Visit
Admission: RE | Admit: 2018-04-11 | Discharge: 2018-04-11 | Disposition: A | Discharge status: Home / Self Care | Payer: BLUE CROSS/BLUE SHIELD | Source: Ambulatory Visit | Attending: Pain Medicine | Admitting: Pain Medicine

## 2018-04-11 VITALS — BP 126/84 | HR 51 | Temp 97.9°F | Resp 12 | Ht 65.0 in | Wt 198.0 lb

## 2018-04-11 DIAGNOSIS — Z9884 Bariatric surgery status: Secondary | ICD-10-CM | POA: Diagnosis not present

## 2018-04-11 DIAGNOSIS — Z881 Allergy status to other antibiotic agents status: Secondary | ICD-10-CM | POA: Diagnosis not present

## 2018-04-11 DIAGNOSIS — Z79899 Other long term (current) drug therapy: Secondary | ICD-10-CM | POA: Diagnosis not present

## 2018-04-11 DIAGNOSIS — M25552 Pain in left hip: Secondary | ICD-10-CM | POA: Diagnosis not present

## 2018-04-11 DIAGNOSIS — Z79890 Hormone replacement therapy (postmenopausal): Secondary | ICD-10-CM | POA: Diagnosis not present

## 2018-04-11 DIAGNOSIS — F419 Anxiety disorder, unspecified: Secondary | ICD-10-CM | POA: Insufficient documentation

## 2018-04-11 DIAGNOSIS — Z886 Allergy status to analgesic agent status: Secondary | ICD-10-CM | POA: Diagnosis not present

## 2018-04-11 DIAGNOSIS — M5136 Other intervertebral disc degeneration, lumbar region: Secondary | ICD-10-CM

## 2018-04-11 DIAGNOSIS — M545 Low back pain, unspecified: Secondary | ICD-10-CM

## 2018-04-11 DIAGNOSIS — M25551 Pain in right hip: Secondary | ICD-10-CM | POA: Insufficient documentation

## 2018-04-11 DIAGNOSIS — M47817 Spondylosis without myelopathy or radiculopathy, lumbosacral region: Secondary | ICD-10-CM | POA: Insufficient documentation

## 2018-04-11 DIAGNOSIS — G8929 Other chronic pain: Secondary | ICD-10-CM | POA: Diagnosis not present

## 2018-04-11 DIAGNOSIS — M47816 Spondylosis without myelopathy or radiculopathy, lumbar region: Secondary | ICD-10-CM

## 2018-04-11 DIAGNOSIS — M51369 Other intervertebral disc degeneration, lumbar region without mention of lumbar back pain or lower extremity pain: Secondary | ICD-10-CM

## 2018-04-11 MED ORDER — MIDAZOLAM HCL 5 MG/5ML IJ SOLN
1.0000 mg | INTRAMUSCULAR | Status: DC | PRN
  Administered 2018-04-11: 4 mg via INTRAVENOUS
  Filled 2018-04-11: qty 5

## 2018-04-11 MED ORDER — LIDOCAINE HCL 2 % IJ SOLN
20.0000 mL | Freq: Once | INTRAMUSCULAR | Status: AC
  Administered 2018-04-11: 400 mg
  Filled 2018-04-11: qty 40

## 2018-04-11 MED ORDER — FENTANYL CITRATE (PF) 100 MCG/2ML IJ SOLN
25.0000 ug | INTRAMUSCULAR | Status: DC | PRN
  Administered 2018-04-11: 100 ug via INTRAVENOUS
  Filled 2018-04-11: qty 2

## 2018-04-11 MED ORDER — LACTATED RINGERS IV SOLN
1000.0000 mL | Freq: Once | INTRAVENOUS | Status: AC
  Administered 2018-04-11: 1000 mL via INTRAVENOUS

## 2018-04-11 MED ORDER — ROPIVACAINE HCL 2 MG/ML IJ SOLN
18.0000 mL | Freq: Once | INTRAMUSCULAR | Status: AC
  Administered 2018-04-11: 18 mL via PERINEURAL
  Filled 2018-04-11: qty 20

## 2018-04-11 MED ORDER — TRIAMCINOLONE ACETONIDE 40 MG/ML IJ SUSP
80.0000 mg | Freq: Once | INTRAMUSCULAR | Status: AC
  Administered 2018-04-11: 80 mg
  Filled 2018-04-11: qty 2

## 2018-04-11 NOTE — Progress Notes (Signed)
Patient's Name: Holly Lopez  MRN: [id number]  Referring Provider: Darvin Neighbours, MD  DOB: 1990-12-23  PCP: Darvin Neighbours  DOS: 04/11/2018  Note by: Oswaldo Done, MD  Service setting: Ambulatory outpatient  Specialty: Interventional Pain Management  Patient type: Established  Location: [facility] (AMB) Pain Management Facility  Visit type: Interventional Procedure   Primary Reason for Visit: Interventional Pain Management Treatment. CC: Hip Pain (bilateral) and Back Pain (low)  Procedure:          Anesthesia, Analgesia, Anxiolysis:  Type: Lumbar Facet, Medial Branch Block(s) #2  Primary Purpose: Diagnostic Region: Posterolateral Lumbosacral Spine Level: L2, L3, L4, L5, & S1 Medial Branch Level(s). Injecting these levels blocks the L3-4, L4-5, and L5-S1 lumbar facet joints. Laterality: Bilateral  Type: Moderate (Conscious) Sedation combined with Local Anesthesia Indication(s): Analgesia and Anxiety Route: Intravenous (IV) IV Access: Secured Sedation: Meaningful verbal contact was maintained at all times during the procedure  Local Anesthetic: Lidocaine 1-2%  Position: Prone   Indications: 1. Spondylosis without myelopathy or radiculopathy, lumbosacral region   2. Lumbar facet syndrome (Bilateral) (R>L)   3. DDD (degenerative disc disease), lumbar   4. Chronic low back pain (Primary Area of Pain) (Bilateral) w/o sciatica    Pain Score: Pre-procedure: 4 /10 Post-procedure: 0-No pain/10  Pre-op Assessment:  [name] is a 27 y.o. (year old), female patient, seen today for interventional treatment. She  has a past surgical history that includes gallbadder and Laparoscopic gastric sleeve resection. [name] has a current medication list which includes the following prescription(s): cholecalciferol, cyanocobalamin, cyclobenzaprine, doxylamine (sleep), folic acid-pyridoxine-cyancobalamin, levonorgestrel-ethinyl estradiol, levothyroxine, melatonin, multivitamin, tramadol, and UNABLE  TO FIND, and the following Facility-Administered Medications: fentanyl and midazolam. Her primarily concern today is the Hip Pain (bilateral) and Back Pain (low)  Initial Vital Signs:  Pulse/HCG Rate: 73ECG Heart Rate: 67 Temp: 98.4 F (36.9 C) Resp: 16 BP: 130/88 SpO2: 100 %  BMI: Estimated body mass index is 32.95 kg/m as calculated from the following:   Height as of this encounter: 5\' 5"  (1.651 m).   Weight as of this encounter: 198 lb (89.8 kg).  Risk Assessment: Allergies: Reviewed. She is allergic to nsaids and doxycycline.  Allergy Precautions: None required Coagulopathies: Reviewed. None identified.  Blood-thinner therapy: None at this time Active Infection(s): Reviewed. None identified. [name] is afebrile  Site Confirmation: [name] was asked to confirm the procedure and laterality before marking the site Procedure checklist: Completed Consent: Before the procedure and under the influence of no sedative(s), amnesic(s), or anxiolytics, the patient was informed of the treatment options, risks and possible complications. To fulfill our ethical and legal obligations, as recommended by the American Medical Association's Code of Ethics, I have informed the patient of my clinical impression; the nature and purpose of the treatment or procedure; the risks, benefits, and possible complications of the intervention; the alternatives, including doing nothing; the risk(s) and benefit(s) of the alternative treatment(s) or procedure(s); and the risk(s) and benefit(s) of doing nothing. The patient was provided information about the general risks and possible complications associated with the procedure. These may include, but are not limited to: failure to achieve desired goals, infection, bleeding, organ or nerve damage, allergic reactions, paralysis, and death. In addition, the patient was informed of those risks and complications associated to Spine-related procedures, such as failure to  decrease pain; infection (i.e.: Meningitis, epidural or intraspinal abscess); bleeding (i.e.: epidural hematoma, subarachnoid hemorrhage, or any other type of intraspinal or peri-dural bleeding); organ  or nerve damage (i.e.: Any type of peripheral nerve, nerve root, or spinal cord injury) with subsequent damage to sensory, motor, and/or autonomic systems, resulting in permanent pain, numbness, and/or weakness of one or several areas of the body; allergic reactions; (i.e.: anaphylactic reaction); and/or death. Furthermore, the patient was informed of those risks and complications associated with the medications. These include, but are not limited to: allergic reactions (i.e.: anaphylactic or anaphylactoid reaction(s)); adrenal axis suppression; blood sugar elevation that in diabetics may result in ketoacidosis or comma; water retention that in patients with history of congestive heart failure may result in shortness of breath, pulmonary edema, and decompensation with resultant heart failure; weight gain; swelling or edema; medication-induced neural toxicity; particulate matter embolism and blood vessel occlusion with resultant organ, and/or nervous system infarction; and/or aseptic necrosis of one or more joints. Finally, the patient was informed that Medicine is not an exact science; therefore, there is also the possibility of unforeseen or unpredictable risks and/or possible complications that may result in a catastrophic outcome. The patient indicated having understood very clearly. We have given the patient no guarantees and we have made no promises. Enough time was given to the patient to ask questions, all of which were answered to the patient's satisfaction. [name] has indicated that she wanted to continue with the procedure. Attestation: I, the ordering provider, attest that I have discussed with the patient the benefits, risks, side-effects, alternatives, likelihood of achieving goals, and potential  problems during recovery for the procedure that I have provided informed consent. Date  Time: 04/11/2018  9:42 AM  Pre-Procedure Preparation:  Monitoring: As per clinic protocol. Respiration, ETCO2, SpO2, BP, heart rate and rhythm monitor placed and checked for adequate function Safety Precautions: Patient was assessed for positional comfort and pressure points before starting the procedure. Time-out: I initiated and conducted the "Time-out" before starting the procedure, as per protocol. The patient was asked to participate by confirming the accuracy of the "Time Out" information. Verification of the correct person, site, and procedure were performed and confirmed by me, the nursing staff, and the patient. "Time-out" conducted as per Joint Commission's Universal Protocol (UP.01.01.01). Time: 1038  Description of Procedure:          Laterality: Bilateral. The procedure was performed in identical fashion on both sides. Levels:  L2, L3, L4, L5, & S1 Medial Branch Level(s) Area Prepped: Posterior Lumbosacral Region Prepping solution: ChloraPrep (2% chlorhexidine gluconate and 70% isopropyl alcohol) Safety Precautions: Aspiration looking for blood return was conducted prior to all injections. At no point did we inject any substances, as a needle was being advanced. Before injecting, the patient was told to immediately notify me if she was experiencing any new onset of "ringing in the ears, or metallic taste in the mouth". No attempts were made at seeking any paresthesias. Safe injection practices and needle disposal techniques used. Medications properly checked for expiration dates. SDV (single dose vial) medications used. After the completion of the procedure, all disposable equipment used was discarded in the proper designated medical waste containers. Local Anesthesia: Protocol guidelines were followed. The patient was positioned over the fluoroscopy table. The area was prepped in the usual manner. The  time-out was completed. The target area was identified using fluoroscopy. A 12-in long, straight, sterile hemostat was used with fluoroscopic guidance to locate the targets for each level blocked. Once located, the skin was marked with an approved surgical skin marker. Once all sites were marked, the skin (epidermis, dermis, and hypodermis),  as well as deeper tissues (fat, connective tissue and muscle) were infiltrated with a small amount of a short-acting local anesthetic, loaded on a 10cc syringe with a 25G, 1.5-in  Needle. An appropriate amount of time was allowed for local anesthetics to take effect before proceeding to the next step. Local Anesthetic: Lidocaine 2.0% The unused portion of the local anesthetic was discarded in the proper designated containers. Technical explanation of process:  L2 Medial Branch Nerve Block (MBB): The target area for the L2 medial branch is at the junction of the postero-lateral aspect of the superior articular process and the superior, posterior, and medial edge of the transverse process of L3. Under fluoroscopic guidance, a Quincke needle was inserted until contact was made with os over the superior postero-lateral aspect of the pedicular shadow (target area). After negative aspiration for blood, 0.5 mL of the nerve block solution was injected without difficulty or complication. The needle was removed intact. L3 Medial Branch Nerve Block (MBB): The target area for the L3 medial branch is at the junction of the postero-lateral aspect of the superior articular process and the superior, posterior, and medial edge of the transverse process of L4. Under fluoroscopic guidance, a Quincke needle was inserted until contact was made with os over the superior postero-lateral aspect of the pedicular shadow (target area). After negative aspiration for blood, 0.5 mL of the nerve block solution was injected without difficulty or complication. The needle was removed intact. L4 Medial  Branch Nerve Block (MBB): The target area for the L4 medial branch is at the junction of the postero-lateral aspect of the superior articular process and the superior, posterior, and medial edge of the transverse process of L5. Under fluoroscopic guidance, a Quincke needle was inserted until contact was made with os over the superior postero-lateral aspect of the pedicular shadow (target area). After negative aspiration for blood, 0.5 mL of the nerve block solution was injected without difficulty or complication. The needle was removed intact. L5 Medial Branch Nerve Block (MBB): The target area for the L5 medial branch is at the junction of the postero-lateral aspect of the superior articular process and the superior, posterior, and medial edge of the sacral ala. Under fluoroscopic guidance, a Quincke needle was inserted until contact was made with os over the superior postero-lateral aspect of the pedicular shadow (target area). After negative aspiration for blood, 0.5 mL of the nerve block solution was injected without difficulty or complication. The needle was removed intact. S1 Medial Branch Nerve Block (MBB): The target area for the S1 medial branch is at the posterior and inferior 6 o'clock position of the L5-S1 facet joint. Under fluoroscopic guidance, the Quincke needle inserted for the L5 MBB was redirected until contact was made with os over the inferior and postero aspect of the sacrum, at the 6 o' clock position under the L5-S1 facet joint (Target area). After negative aspiration for blood, 0.5 mL of the nerve block solution was injected without difficulty or complication. The needle was removed intact. Procedural Needles: 22-gauge, 3.5-inch, Quincke needles used for all levels. Nerve block solution: 0.2% PF-Ropivacaine + Triamcinolone (40 mg/mL) diluted to a final concentration of 4 mg of Triamcinolone/mL of Ropivacaine The unused portion of the solution was discarded in the proper designated  containers.  Once the entire procedure was completed, the treated area was cleaned, making sure to leave some of the prepping solution back to take advantage of its long term bactericidal properties.   Illustration of the posterior view  of the lumbar spine and the posterior neural structures. Laminae of L2 through S1 are labeled. DPRL5, dorsal primary ramus of L5; DPRS1, dorsal primary ramus of S1; DPR3, dorsal primary ramus of L3; FJ, facet (zygapophyseal) joint L3-L4; I, inferior articular process of L4; LB1, lateral branch of dorsal primary ramus of L1; IAB, inferior articular branches from L3 medial branch (supplies L4-L5 facet joint); IBP, intermediate branch plexus; MB3, medial branch of dorsal primary ramus of L3; NR3, third lumbar nerve root; S, superior articular process of L5; SAB, superior articular branches from L4 (supplies L4-5 facet joint also); TP3, transverse process of L3.  Vitals:   04/11/18 1056 04/11/18 1106 04/11/18 1116 04/11/18 1126  BP: (!) 143/107 123/86 125/86 126/84  Pulse:  (!) 55 (!) 52 (!) 51  Resp: 12 15 14 12   Temp:  97.9 F (36.6 C)    SpO2: 100% 98% 98% 100%  Weight:      Height:         Start Time: 1038 hrs. End Time: 1053 hrs.  Imaging Guidance (Spinal):          Type of Imaging Technique: Fluoroscopy Guidance (Spinal) Indication(s): Assistance in needle guidance and placement for procedures requiring needle placement in or near specific anatomical locations not easily accessible without such assistance. Exposure Time: Please see nurses notes. Contrast: None used. Fluoroscopic Guidance: I was personally present during the use of fluoroscopy. "Tunnel Vision Technique" used to obtain the best possible view of the target area. Parallax error corrected before commencing the procedure. "Direction-depth-direction" technique used to introduce the needle under continuous pulsed fluoroscopy. Once target was reached, antero-posterior, oblique, and lateral  fluoroscopic projection used confirm needle placement in all planes. Images permanently stored in EMR. Interpretation: No contrast injected. I personally interpreted the imaging intraoperatively. Adequate needle placement confirmed in multiple planes. Permanent images saved into the patient's record.  Antibiotic Prophylaxis:   Anti-infectives (From admission, onward)   None     Indication(s): None identified  Post-operative Assessment:  Post-procedure Vital Signs:  Pulse/HCG Rate: (!) 51(pt reports HR usually on the lower end )68 Temp: 97.9 F (36.6 C) Resp: 12 BP: 126/84 SpO2: 100 %  EBL: None  Complications: No immediate post-treatment complications observed by team, or reported by patient.  Note: The patient tolerated the entire procedure well. A repeat set of vitals were taken after the procedure and the patient was kept under observation following institutional policy, for this type of procedure. Post-procedural neurological assessment was performed, showing return to baseline, prior to discharge. The patient was provided with post-procedure discharge instructions, including a section on how to identify potential problems. Should any problems arise concerning this procedure, the patient was given instructions to immediately contact us, at any time, without hesitation. In any case, we plan to contact the patient by telephone for a follow-up status report regarding this interventional procedure.  Comments:  No additional relevant information.  Plan of Care   Imaging Orders     DG C-Arm 1-60 Min-No Report  Procedure Orders     LUMBAR FACET(MEDIAL BRANCH NERVE BLOCK) MBNB  Medications ordered for procedure: Meds ordered this encounter  Medications  . lidocaine (XYLOCAINE) 2 % (with pres) injection 400 mg  . midazolam (VERSED) 5 MG/5ML injection 1-2 mg    Make sure Flumazenil is available in the pyxis when using this medication. If oversedation occurs, administer 0.2 mg IV  over 15 sec. If after 45 sec no response, administer 0.2 mg again over 1 min;  may repeat at 1 min intervals; not to exceed 4 doses (1 mg)  . fentaNYL (SUBLIMAZE) injection 25-50 mcg    Make sure Narcan is available in the pyxis when using this medication. In the event of respiratory depression (RR< 8/min): Titrate NARCAN (naloxone) in increments of 0.1 to 0.2 mg IV at 2-3 minute intervals, until desired degree of reversal.  . lactated ringers infusion 1,000 mL  . ropivacaine (PF) 2 mg/mL (0.2%) (NAROPIN) injection 18 mL  . triamcinolone acetonide (KENALOG-40) injection 80 mg   Medications administered: We administered lidocaine, midazolam, fentaNYL, lactated ringers, ropivacaine (PF) 2 mg/mL (0.2%), and triamcinolone acetonide.  See the medical record for exact dosing, route, and time of administration.  New Prescriptions   No medications on file   Disposition: Discharge home  Discharge Date & Time: 04/11/2018; 1129 hrs.   Physician-requested Follow-up: Return for post-procedure eval (2 wks), w/ Dr. Laban Emperor.  Future Appointments  Date Time Provider Department Center  04/26/2018  1:45 PM Delano Metz, MD [facility] None   Primary Care Physician: Darvin Neighbours Location: [facility] Note by: Oswaldo Done, MD Date: 04/11/2018; Time: 12:38 PM  Disclaimer:  Medicine is not an exact science. The only guarantee in medicine is that nothing is guaranteed. It is important to note that the decision to proceed with this intervention was based on the information collected from the patient. The Data and conclusions were drawn from the patient's questionnaire, the interview, and the physical examination. Because the information was provided in large part by the patient, it cannot be guaranteed that it has not been purposely or unconsciously manipulated. Every effort has been made to obtain as much relevant data as possible for this evaluation. It is important  to note that the conclusions that lead to this procedure are derived in large part from the available data. Always take into account that the treatment will also be dependent on availability of resources and existing treatment guidelines, considered by other Pain Management Practitioners as being common knowledge and practice, at the time of the intervention. For Medico-Legal purposes, it is also important to point out that variation in procedural techniques and pharmacological choices are the acceptable norm. The indications, contraindications, technique, and results of the above procedure should only be interpreted and judged by a Board-Certified Interventional Pain Specialist with extensive familiarity and expertise in the same exact procedure and technique.

## 2018-04-11 NOTE — Progress Notes (Signed)
Safety precautions to be maintained throughout the outpatient stay will include: orient to surroundings, keep bed in low position, maintain call bell within reach at all times, provide assistance with transfer out of bed and ambulation.  

## 2018-04-11 NOTE — Patient Instructions (Signed)

## 2018-04-12 ENCOUNTER — Telehealth: Payer: Self-pay | Admitting: *Deleted

## 2018-04-12 NOTE — Telephone Encounter (Signed)
Attempted to call for post procedure follow-up. Message left. 

## 2018-04-24 NOTE — Progress Notes (Signed)
Patient's Name: Holly Lopez  MRN: [id number]  Referring Provider: Orlena Sheldon, MD  DOB: 1991/07/02  PCP: Orlena Sheldon  DOS: 04/26/2018  Note by: Gaspar Cola, MD  Service setting: Ambulatory outpatient  Specialty: Interventional Pain Management  Location: [facility] (AMB) Pain Management Facility    Patient type: Established   Primary Reason(s) for Visit: Encounter for post-procedure evaluation of chronic illness with mild to moderate exacerbation CC: Back Pain  HPI  Holly Lopez is a 27 y.o. year old, female patient, who comes today for a post-procedure evaluation. She has Abdominal pain; B12 deficiency; Depression; Primary fibromyalgia syndrome; History of pernicious anemia; Hyperlipidemia; Hypothyroidism; Joint pain; Malaise and fatigue; Morbid obesity ([place]); S/P laparoscopic sleeve gastrectomy; Symptomatic cholelithiasis; Chronic hip pain (Bilateral); Chronic sacroiliac joint pain (Bilateral); Chronic lower extremity pain (Secondary Area of Pain) (Bilateral); Chronic neck pain (Tertiary Area of Pain) (Bilateral) (R>L); Chronic pain syndrome; Long term current use of opiate analgesic; Pharmacologic therapy; Disorder of skeletal system; Problems influencing health status; Fibromyalgia; Chronic musculoskeletal pain; Chronic low back pain (Primary Area of Pain) (Bilateral) (R>L) w/o sciatica; Cervicalgia; Cervical facet syndrome (Bilateral) (R>L); Lumbar facet syndrome (Bilateral) (R>L); Spondylosis without myelopathy or radiculopathy, lumbosacral region; and DDD (degenerative disc disease), lumbar on their problem list. Her primarily concern today is the Back Pain  Pain Assessment: Location: Lower Back Radiating: Denies Onset: More than a month ago Duration: Chronic pain Quality: Aching Severity: 1 /10 (subjective, self-reported pain score)  Note: Reported level is compatible with observation.                         When using our objective Pain Scale, levels between 6 and 10/10 are said  to belong in an emergency room, as it progressively worsens from a 6/10, described as severely limiting, requiring emergency care not usually available at an outpatient pain management facility. At a 6/10 level, communication becomes difficult and requires great effort. Assistance to reach the emergency department may be required. Facial flushing and profuse sweating along with potentially dangerous increases in heart rate and blood pressure will be evident. Effect on ADL: no prolonged standing, no sitting in chair to long, no long distant driving Timing: Constant Modifying factors: medications and rest BP: (!) 126/94  HR: 68  Holly Lopez comes in today for post-procedure evaluation.  Further details on both, my assessment(s), as well as the proposed treatment plan, please see below.  Post-Procedure Assessment  04/11/2018 Procedure: Diagnostic bilateral lumbar facet block #2 under fluoroscopic guidance and IV sedation Pre-procedure pain score:  4/10 Post-procedure pain score: 0/10 (100% relief) Influential Factors: BMI: 32.95 kg/m Intra-procedural challenges: None observed.         Assessment challenges: None detected.              Reported side-effects: None.        Post-procedural adverse reactions or complications: None reported         Sedation: Sedation provided. When no sedatives are used, the analgesic levels obtained are directly associated to the effectiveness of the local anesthetics. However, when sedation is provided, the level of analgesia obtained during the initial 1 hour following the intervention, is believed to be the result of a combination of factors. These factors may include, but are not limited to: 1. The effectiveness of the local anesthetics used. 2. The effects of the analgesic(s) and/or anxiolytic(s) used. 3. The degree of discomfort experienced by the patient at the time of the procedure.  4. The patients ability and reliability in recalling and recording the  events. 5. The presence and influence of possible secondary gains and/or psychosocial factors. Reported result: Relief experienced during the 1st hour after the procedure: 100 % (Ultra-Short Term Relief)            Interpretative annotation: Clinically appropriate result. Analgesia during this period is likely to be Local Anesthetic and/or IV Sedative (Analgesic/Anxiolytic) related.          Effects of local anesthetic: The analgesic effects attained during this period are directly associated to the localized infiltration of local anesthetics and therefore cary significant diagnostic value as to the etiological location, or anatomical origin, of the pain. Expected duration of relief is directly dependent on the pharmacodynamics of the local anesthetic used. Long-acting (4-6 hours) anesthetics used.  Reported result: Relief during the next 4 to 6 hour after the procedure: 100 % (Short-Term Relief)            Interpretative annotation: Clinically appropriate result. Analgesia during this period is likely to be Local Anesthetic-related.          Long-term benefit: Defined as the period of time past the expected duration of local anesthetics (1 hour for short-acting and 4-6 hours for long-acting). With the possible exception of prolonged sympathetic blockade from the local anesthetics, benefits during this period are typically attributed to, or associated with, other factors such as analgesic sensory neuropraxia, antiinflammatory effects, or beneficial biochemical changes provided by agents other than the local anesthetics.  Reported result: Extended relief following procedure: 100 % x 1 wk (Long-Term Relief)            Interpretative annotation: Clinically possible results. Good relief. No permanent benefit expected. Inflammation plays a part in the etiology to the pain.          Current benefits: Defined as reported results that persistent at this point in time.   Analgesia: 95 %            Function:  Somewhat improved ROM: Somewhat improved Interpretative annotation: Clinically significant results. Limited therapeutic benefit. Results would suggest persistent aggravating factors.          Interpretation: Results would suggest Holly Lopez to be a good candidate for Radiofrequency Ablation. We'll proceed with the next treatment, as soon as convenient Holly Lopez indicates having had an unsuccessful trial of physical therapy, which she described as non-beneficial and painful.  Plan:  Proceed with Radiofrequency Ablation for the purpose of attaining long-term benefits.       "The patient has failed to respond to conservative therapies including over-the-counter medications, anti-inflammatories, muscle relaxants, membrane stabilizers, opioids, physical therapy modalities such as heat and ice, as well as more invasive techniques such as nerve blocks. Because Holly Lopez did attain more than 50% relief of the pain during a series of diagnostic blocks conducted in separate occasions, I believe it is medically necessary to proceed with Radiofrequency Ablation, in order to attempt gaining longer relief.   Medical Necessity: Holly Lopez has been dealing with the above chronic pain from the Spondylosis without myelopathy or radiculopathy, lumbar region [M47.816] for longer than three months and has either failed to respond, was unable to tolerate, or simply did not get enough benefit from other more conservative therapies including, but not limited to: 1. Over-the-counter medications 2. Anti-inflammatory medications 3. Muscle relaxants 4. Membrane stabilizers 5. Opioids 6. Physical therapy 7. Modalities (Heat, ice, etc.) 8. Invasive techniques such as nerve blocks. Holly Lopez has  attained more than 50% relief of the pain from a series of diagnostic injections conducted in separate occasions. For this reason, I believe it is medically necessary to proceed with Radiofrequency Ablation for the purpose of attempting to  prolong the duration of the benefits seen with the diagnostic injections.    Laboratory Chemistry  Inflammation Markers (CRP: Acute Phase) (ESR: Chronic Phase) Lab Results  Component Value Date   CRP 9 01/31/2018   ESRSEDRATE 6 01/31/2018                         Renal Markers Lab Results  Component Value Date   BUN 11 01/31/2018   CREATININE 0.95 01/31/2018   BCR 12 01/31/2018   GFRAA 94 01/31/2018   GFRNONAA 81 01/31/2018                             Hepatic Markers Lab Results  Component Value Date   AST 17 01/31/2018   ALBUMIN 4.4 01/31/2018                        Neuropathy Markers Lab Results  Component Value Date   VITAMINB12 917 01/31/2018                        Hematology Parameters No results found for: INR, LABPROT, APTT, PLT, HGB, HCT                      CV Markers No results found for: BNP, CKTOTAL, CKMB, TROPONINI                       Note: Lab results reviewed.  Recent Imaging Results   Results for orders placed in visit on 04/11/18  DG C-Arm 1-60 Min-No Report   Narrative Fluoroscopy was utilized by the requesting physician.  No radiographic  interpretation.    Interpretation Report: Fluoroscopy was used during the procedure to assist with needle guidance. The images were interpreted intraoperatively by the requesting physician.  Meds   Current Outpatient Medications:  .  cholecalciferol (VITAMIN D) 1000 units tablet, Take 1,000 Units by mouth daily., Disp: , Rfl:  .  Cyanocobalamin (B-12 COMPLIANCE INJECTION) 1000 MCG/ML KIT, Inject as directed., Disp: , Rfl:  .  cyclobenzaprine (FLEXERIL) 5 MG tablet, Take by mouth., Disp: , Rfl:  .  doxylamine, Sleep, (UNISOM) 25 MG tablet, Take 25 mg by mouth at bedtime as needed., Disp: , Rfl:  .  folic acid-pyridoxine-cyancobalamin (FOLTX) 2.5-25-2 MG TABS tablet, Take 1 tablet by mouth daily., Disp: , Rfl:  .  levonorgestrel-ethinyl estradiol (ENPRESSE,TRIVORA) tablet, Take 1 tablet by mouth daily.,  Disp: , Rfl:  .  levothyroxine (SYNTHROID, LEVOTHROID) 25 MCG tablet, levothyroxine 25 mcg tablet, Disp: , Rfl:  .  Melatonin 1 MG CAPS, Take by mouth., Disp: , Rfl:  .  Multiple Vitamin (MULTIVITAMIN) capsule, Take 1 capsule by mouth daily., Disp: , Rfl:  .  traMADol (ULTRAM) 50 MG tablet, Take by mouth every 6 (six) hours as needed., Disp: , Rfl:  .  UNABLE TO FIND, Take 5 g by mouth as needed., Disp: , Rfl:   ROS  Constitutional: Denies any fever or chills Gastrointestinal: No reported hemesis, hematochezia, vomiting, or acute GI distress Musculoskeletal: Denies any acute onset joint swelling, redness, loss of ROM, or weakness Neurological: No reported  episodes of acute onset apraxia, aphasia, dysarthria, agnosia, amnesia, paralysis, loss of coordination, or loss of consciousness  Allergies  Holly Lopez is allergic to nsaids and doxycycline.  PFSH  Drug: Holly Lopez  has no drug history on file. Alcohol:  has no alcohol history on file. Tobacco:  reports that she has never smoked. She has never used smokeless tobacco. Medical:  has a past medical history of Allergy, Fibromyalgia, Thyroid disease, and Vitamin B12 deficiency (non anemic). Surgical: Holly Lopez  has a past surgical history that includes gallbadder and Laparoscopic gastric sleeve resection. Family: family history includes Diabetes in her brother; Heart disease in her brother; Hypertension in her brother and father; Thyroid disease in her mother.  Constitutional Exam  General appearance: Well nourished, well developed, and well hydrated. In no apparent acute distress Vitals:   04/26/18 1409  BP: (!) 126/94  Pulse: 68  Temp: 98.4 F (36.9 C)  SpO2: 100%  Weight: 198 lb (89.8 kg)  Height: 5' 5"  (1.651 m)   BMI Assessment: Estimated body mass index is 32.95 kg/m as calculated from the following:   Height as of this encounter: 5' 5"  (1.651 m).   Weight as of this encounter: 198 lb (89.8 kg).  BMI interpretation  table: BMI level Category Range association with higher incidence of chronic pain  <18 kg/m2 Underweight   18.5-24.9 kg/m2 Ideal body weight   25-29.9 kg/m2 Overweight Increased incidence by 20%  30-34.9 kg/m2 Obese (Class I) Increased incidence by 68%  35-39.9 kg/m2 Severe obesity (Class II) Increased incidence by 136%  >40 kg/m2 Extreme obesity (Class III) Increased incidence by 254%   Patient's current BMI Ideal Body weight  Body mass index is 32.95 kg/m. Ideal body weight: 57 kg (125 lb 10.6 oz) Adjusted ideal body weight: 70.1 kg (154 lb 9.6 oz)   BMI Readings from Last 4 Encounters:  04/26/18 32.95 kg/m  04/11/18 32.95 kg/m  03/27/18 33.12 kg/m  03/07/18 32.95 kg/m   Wt Readings from Last 4 Encounters:  04/26/18 198 lb (89.8 kg)  04/11/18 198 lb (89.8 kg)  03/27/18 199 lb (90.3 kg)  03/07/18 198 lb (89.8 kg)  Psych/Mental status: Alert, oriented x 3 (person, place, & time)       Eyes: PERLA Respiratory: No evidence of acute respiratory distress  Cervical Spine Area Exam  Skin & Axial Inspection: No masses, redness, edema, swelling, or associated skin lesions Alignment: Symmetrical Functional ROM: Unrestricted ROM      Stability: No instability detected Muscle Tone/Strength: Functionally intact. No obvious neuro-muscular anomalies detected. Sensory (Neurological): Unimpaired Palpation: No palpable anomalies              Upper Extremity (UE) Exam    Side: Right upper extremity  Side: Left upper extremity  Skin & Extremity Inspection: Skin color, temperature, and hair growth are WNL. No peripheral edema or cyanosis. No masses, redness, swelling, asymmetry, or associated skin lesions. No contractures.  Skin & Extremity Inspection: Skin color, temperature, and hair growth are WNL. No peripheral edema or cyanosis. No masses, redness, swelling, asymmetry, or associated skin lesions. No contractures.  Functional ROM: Unrestricted ROM          Functional ROM: Unrestricted  ROM          Muscle Tone/Strength: Functionally intact. No obvious neuro-muscular anomalies detected.  Muscle Tone/Strength: Functionally intact. No obvious neuro-muscular anomalies detected.  Sensory (Neurological): Unimpaired          Sensory (Neurological): Unimpaired  Palpation: No palpable anomalies              Palpation: No palpable anomalies              Provocative Test(s):  Phalen's test: deferred Tinel's test: deferred Apley's scratch test (touch opposite shoulder):  Action 1 (Across chest): deferred Action 2 (Overhead): deferred Action 3 (LB reach): deferred   Provocative Test(s):  Phalen's test: deferred Tinel's test: deferred Apley's scratch test (touch opposite shoulder):  Action 1 (Across chest): deferred Action 2 (Overhead): deferred Action 3 (LB reach): deferred    Thoracic Spine Area Exam  Skin & Axial Inspection: No masses, redness, or swelling Alignment: Symmetrical Functional ROM: Unrestricted ROM Stability: No instability detected Muscle Tone/Strength: Functionally intact. No obvious neuro-muscular anomalies detected. Sensory (Neurological): Unimpaired Muscle strength & Tone: No palpable anomalies  Lumbar Spine Area Exam  Skin & Axial Inspection: No masses, redness, or swelling Alignment: Symmetrical Functional ROM: Unrestricted ROM       Stability: No instability detected Muscle Tone/Strength: Functionally intact. No obvious neuro-muscular anomalies detected. Sensory (Neurological): Unimpaired Palpation: No palpable anomalies       Provocative Tests: Hyperextension/rotation test: deferred today       Lumbar quadrant test (Kemp's test): deferred today       Lateral bending test: deferred today       Patrick's Maneuver: deferred today                   FABER test: deferred today                   S-I anterior distraction/compression test: deferred today         S-I lateral compression test: deferred today         S-I Thigh-thrust test:  deferred today         S-I Gaenslen's test: deferred today          Gait & Posture Assessment  Ambulation: Unassisted Gait: Relatively normal for age and body habitus Posture: WNL   Lower Extremity Exam    Side: Right lower extremity  Side: Left lower extremity  Stability: No instability observed          Stability: No instability observed          Skin & Extremity Inspection: Skin color, temperature, and hair growth are WNL. No peripheral edema or cyanosis. No masses, redness, swelling, asymmetry, or associated skin lesions. No contractures.  Skin & Extremity Inspection: Skin color, temperature, and hair growth are WNL. No peripheral edema or cyanosis. No masses, redness, swelling, asymmetry, or associated skin lesions. No contractures.  Functional ROM: Unrestricted ROM                  Functional ROM: Unrestricted ROM                  Muscle Tone/Strength: Functionally intact. No obvious neuro-muscular anomalies detected.  Muscle Tone/Strength: Functionally intact. No obvious neuro-muscular anomalies detected.  Sensory (Neurological): Unimpaired  Sensory (Neurological): Unimpaired  Palpation: No palpable anomalies  Palpation: No palpable anomalies   Assessment  Primary Diagnosis & Pertinent Problem List: The primary encounter diagnosis was Chronic low back pain (Primary Area of Pain) (Bilateral) w/o sciatica. Diagnoses of Chronic lower extremity pain (Secondary Area of Pain) (Bilateral), Chronic neck pain (Tertiary Area of Pain) (Bilateral) (R>L), Lumbar facet syndrome (Bilateral) (R>L), Chronic sacroiliac joint pain (Bilateral), and Spondylosis without myelopathy or radiculopathy, lumbosacral region were also pertinent to this visit.  Status Diagnosis  Controlled Controlled Controlled 1. Chronic low back pain (Primary Area of Pain) (Bilateral) w/o sciatica   2. Chronic lower extremity pain (Secondary Area of Pain) (Bilateral)   3. Chronic neck pain (Tertiary Area of Pain) (Bilateral)  (R>L)   4. Lumbar facet syndrome (Bilateral) (R>L)   5. Chronic sacroiliac joint pain (Bilateral)   6. Spondylosis without myelopathy or radiculopathy, lumbosacral region     Problems updated and reviewed during this visit: Problem  Chronic low back pain (Primary Area of Pain) (Bilateral) (R>L) w/o sciatica   Plan of Care  Pharmacotherapy (Medications Ordered): No orders of the defined types were placed in this encounter.  Medications administered today: Holly Lopez had no medications administered during this visit.   Procedure Orders     LUMBAR FACET(MEDIAL BRANCH NERVE BLOCK) MBNB     Radiofrequency,Lumbar Lab Orders  No laboratory test(s) ordered today   Imaging Orders  No imaging studies ordered today   Referral Orders  No referral(s) requested today   Interventional management options: Planned, scheduled, and/or pending:   Therapeutic bilateral lumbar facet block #1 under fluoroscopic guidance and IV sedation, starting with the right side   Considering:   Diagnostic trigger point injections Diagnostic bilateral sacroiliac joint injections Possible bilateral sacroiliac joint RFA Diagnostic bilateral lumbar facet block Possible bilateral lumbar facet RFA Diagnostic bilateral intra-articular hip joint injection Diagnostic bilateral femoral nerve + obturator nerve block Possible bilateral femoral nerve + obturator nerve RFA Diagnostic L4-5 interlaminar LESI Diagnostic right-sidedCervicalESI Diagnostic bilateral cervical facet block Possible bilateral cervical facet RFA   Palliative PRN treatment(s):   Palliative bilateral lumbar facet block under fluoroscopic guidance and IV sedation   Provider-requested follow-up: Return for Procedure (w/ sedation): (R) L-FCT RFA #1.  No future appointments. Primary Care Physician: Orlena Sheldon Location: [facility] Note by: Gaspar Cola, MD Date: 04/26/2018; Time: 3:48  PM

## 2018-04-26 ENCOUNTER — Encounter: Payer: Self-pay | Admitting: Pain Medicine

## 2018-04-26 ENCOUNTER — Other Ambulatory Visit: Payer: Self-pay

## 2018-04-26 ENCOUNTER — Ambulatory Visit: Payer: BLUE CROSS/BLUE SHIELD | Attending: Pain Medicine | Admitting: Pain Medicine

## 2018-04-26 VITALS — BP 126/94 | HR 68 | Temp 98.4°F | Ht 65.0 in | Wt 198.0 lb

## 2018-04-26 DIAGNOSIS — M79604 Pain in right leg: Secondary | ICD-10-CM

## 2018-04-26 DIAGNOSIS — E039 Hypothyroidism, unspecified: Secondary | ICD-10-CM | POA: Diagnosis not present

## 2018-04-26 DIAGNOSIS — Z881 Allergy status to other antibiotic agents status: Secondary | ICD-10-CM | POA: Diagnosis not present

## 2018-04-26 DIAGNOSIS — E785 Hyperlipidemia, unspecified: Secondary | ICD-10-CM | POA: Insufficient documentation

## 2018-04-26 DIAGNOSIS — M542 Cervicalgia: Secondary | ICD-10-CM | POA: Diagnosis not present

## 2018-04-26 DIAGNOSIS — M47817 Spondylosis without myelopathy or radiculopathy, lumbosacral region: Secondary | ICD-10-CM

## 2018-04-26 DIAGNOSIS — Z79890 Hormone replacement therapy (postmenopausal): Secondary | ICD-10-CM | POA: Insufficient documentation

## 2018-04-26 DIAGNOSIS — Z6832 Body mass index (BMI) 32.0-32.9, adult: Secondary | ICD-10-CM | POA: Diagnosis not present

## 2018-04-26 DIAGNOSIS — M545 Low back pain: Secondary | ICD-10-CM | POA: Diagnosis not present

## 2018-04-26 DIAGNOSIS — M797 Fibromyalgia: Secondary | ICD-10-CM | POA: Insufficient documentation

## 2018-04-26 DIAGNOSIS — M533 Sacrococcygeal disorders, not elsewhere classified: Secondary | ICD-10-CM | POA: Diagnosis not present

## 2018-04-26 DIAGNOSIS — Z79891 Long term (current) use of opiate analgesic: Secondary | ICD-10-CM | POA: Insufficient documentation

## 2018-04-26 DIAGNOSIS — Z79899 Other long term (current) drug therapy: Secondary | ICD-10-CM | POA: Insufficient documentation

## 2018-04-26 DIAGNOSIS — M47816 Spondylosis without myelopathy or radiculopathy, lumbar region: Secondary | ICD-10-CM | POA: Diagnosis not present

## 2018-04-26 DIAGNOSIS — G8929 Other chronic pain: Secondary | ICD-10-CM

## 2018-04-26 DIAGNOSIS — F329 Major depressive disorder, single episode, unspecified: Secondary | ICD-10-CM | POA: Diagnosis not present

## 2018-04-26 DIAGNOSIS — G894 Chronic pain syndrome: Secondary | ICD-10-CM | POA: Insufficient documentation

## 2018-04-26 DIAGNOSIS — M79605 Pain in left leg: Secondary | ICD-10-CM

## 2018-04-26 DIAGNOSIS — Z886 Allergy status to analgesic agent status: Secondary | ICD-10-CM | POA: Insufficient documentation

## 2018-04-26 DIAGNOSIS — E538 Deficiency of other specified B group vitamins: Secondary | ICD-10-CM | POA: Diagnosis not present

## 2018-04-26 NOTE — Patient Instructions (Signed)
____________________________________________________________________________________________  Preparing for Procedure with Sedation  Instructions: . Oral Intake: Do not eat or drink anything for at least 8 hours prior to your procedure. . Transportation: Public transportation is not allowed. Bring an adult driver. The driver must be physically present in our waiting room before any procedure can be started. . Physical Assistance: Bring an adult physically capable of assisting you, in the event you need help. This adult should keep you company at home for at least 6 hours after the procedure. . Blood Pressure Medicine: Take your blood pressure medicine with a sip of water the morning of the procedure. . Blood thinners: Notify our staff if you are taking any blood thinners. Depending on which one you take, there will be specific instructions on how and when to stop it. . Diabetics on insulin: Notify the staff so that you can be scheduled 1st case in the morning. If your diabetes requires high dose insulin, take only  of your normal insulin dose the morning of the procedure and notify the staff that you have done so. . Preventing infections: Shower with an antibacterial soap the morning of your procedure. . Build-up your immune system: Take 1000 mg of Vitamin C with every meal (3 times a day) the day prior to your procedure. . Antibiotics: Inform the staff if you have a condition or reason that requires you to take antibiotics before dental procedures. . Pregnancy: If you are pregnant, call and cancel the procedure. . Sickness: If you have a cold, fever, or any active infections, call and cancel the procedure. . Arrival: You must be in the facility at least 30 minutes prior to your scheduled procedure. . Children: Do not bring children with you. . Dress appropriately: Bring dark clothing that you would not mind if they get stained. . Valuables: Do not bring any jewelry or valuables.  Procedure  appointments are reserved for interventional treatments only. . No Prescription Refills. . No medication changes will be discussed during procedure appointments. . No disability issues will be discussed.  Reasons to call and reschedule or cancel your procedure: (Following these recommendations will minimize the risk of a serious complication.) . Surgeries: Avoid having procedures within 2 weeks of any surgery. (Avoid for 2 weeks before or after any surgery). . Flu Shots: Avoid having procedures within 2 weeks of a flu shots or . (Avoid for 2 weeks before or after immunizations). . Barium: Avoid having a procedure within 7-10 days after having had a radiological study involving the use of radiological contrast. (Myelograms, Barium swallow or enema study). . Heart attacks: Avoid any elective procedures or surgeries for the initial 6 months after a "Myocardial Infarction" (Heart Attack). . Blood thinners: It is imperative that you stop these medications before procedures. Let us know if you if you take any blood thinner.  . Infection: Avoid procedures during or within two weeks of an infection (including chest colds or gastrointestinal problems). Symptoms associated with infections include: Localized redness, fever, chills, night sweats or profuse sweating, burning sensation when voiding, cough, congestion, stuffiness, runny nose, sore throat, diarrhea, nausea, vomiting, cold or Flu symptoms, recent or current infections. It is specially important if the infection is over the area that we intend to treat. . Heart and lung problems: Symptoms that may suggest an active cardiopulmonary problem include: cough, chest pain, breathing difficulties or shortness of breath, dizziness, ankle swelling, uncontrolled high or unusually low blood pressure, and/or palpitations. If you are experiencing any of these symptoms, cancel   your procedure and contact your primary care physician for an evaluation.  Remember:   Regular Business hours are:  Monday to Thursday 8:00 AM to 4:00 PM  Provider's Schedule: Leeta Grimme, MD:  Procedure days: Tuesday and Thursday 7:30 AM to 4:00 PM  Bilal Lateef, MD:  Procedure days: Monday and Wednesday 7:30 AM to 4:00 PM ____________________________________________________________________________________________    

## 2018-05-15 ENCOUNTER — Telehealth: Payer: Self-pay

## 2018-05-15 NOTE — Telephone Encounter (Signed)
Pt called and want's to know if we can appeal the denial for her Nerve ablasion. She feels that she really need's it.

## 2018-05-22 NOTE — Telephone Encounter (Signed)
Once denied with [employer] we have to wait 6 months before we can submit another request.

## 2018-06-01 ENCOUNTER — Ambulatory Visit: Payer: BLUE CROSS/BLUE SHIELD | Admitting: Pain Medicine

## 2018-06-05 ENCOUNTER — Other Ambulatory Visit: Payer: Self-pay | Admitting: Pain Medicine

## 2018-06-05 DIAGNOSIS — G8929 Other chronic pain: Secondary | ICD-10-CM

## 2018-06-05 DIAGNOSIS — M545 Low back pain: Principal | ICD-10-CM

## 2018-06-05 DIAGNOSIS — M47816 Spondylosis without myelopathy or radiculopathy, lumbar region: Secondary | ICD-10-CM

## 2019-05-07 IMAGING — CR DG SI JOINTS 3+V
1 series · 3 of 3 positions shown · non-contrast
Comparison: None.

CLINICAL DATA: 27-year-old female with chronic pain both hips and
sacroiliac joints. Initial encounter.

EXAM:
BILATERAL SACROILIAC JOINTS - 3+ VIEW

[Series 1: dg si joints · 0.14mm/px · 3 of 3 slices shown]
[im 1/3]
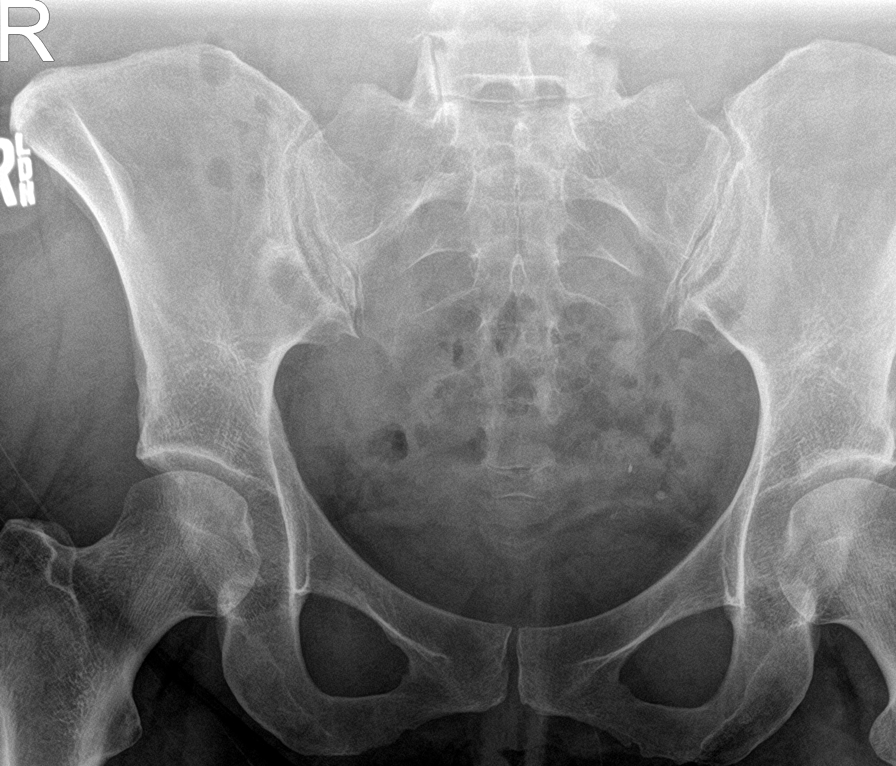
[im 2/3]
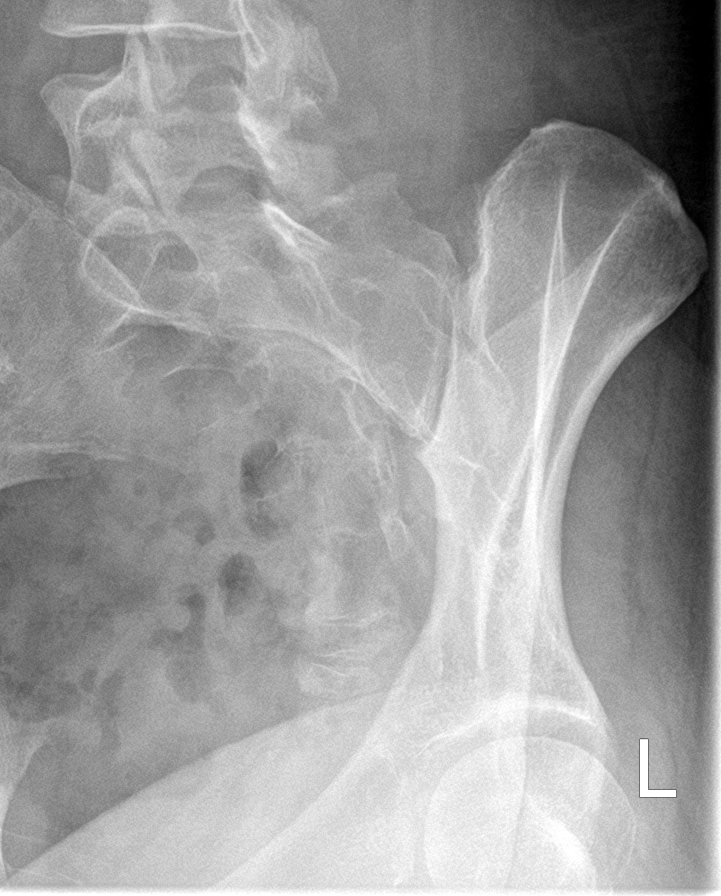
[im 3/3]
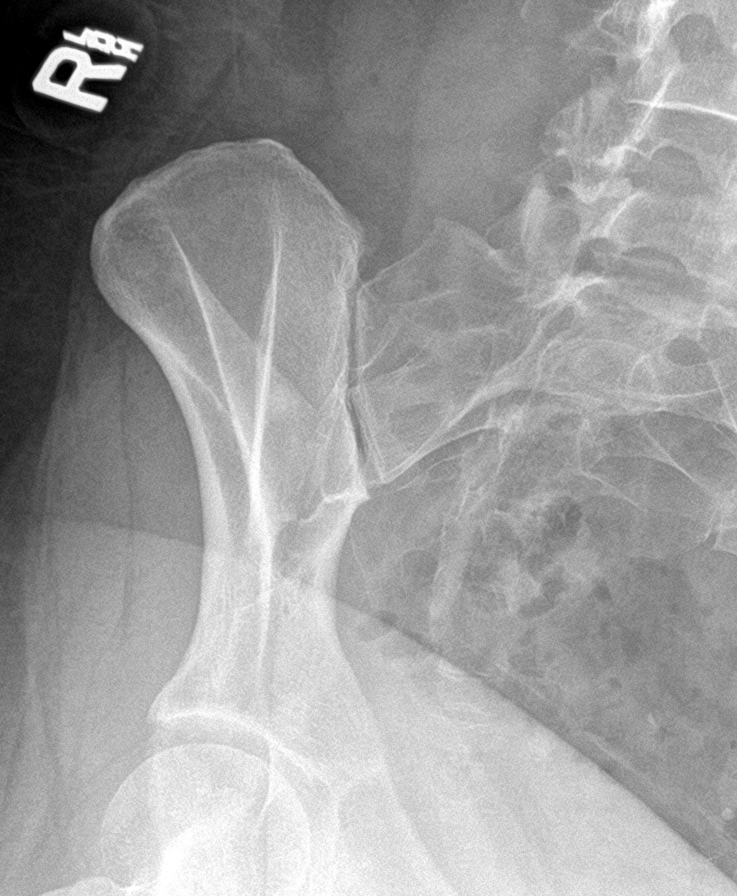

[3 of 3 positions shown; findings below may reference images not displayed]

FINDINGS: Possible small bone island adjacent to the right sacroiliac joint.
Sacroiliac joints otherwise intact.
IMPRESSION: Negative.

## 2019-05-07 IMAGING — CR DG HIP (WITH OR WITHOUT PELVIS) 2-3V*L*
1 series · 3 of 3 positions shown · non-contrast
Comparison: None.

CLINICAL DATA: 27-year-old female with chronic pain both hips and
sacroiliac joints. Initial encounter.

EXAM:
DG HIP (WITH OR WITHOUT PELVIS) 2-3V LEFT

[Series 1: dg hip unilat w or w/o pelvis 2-3 views  · non-contrast · 0.14mm/px · 3 of 3 slices shown]
[im 1/3]
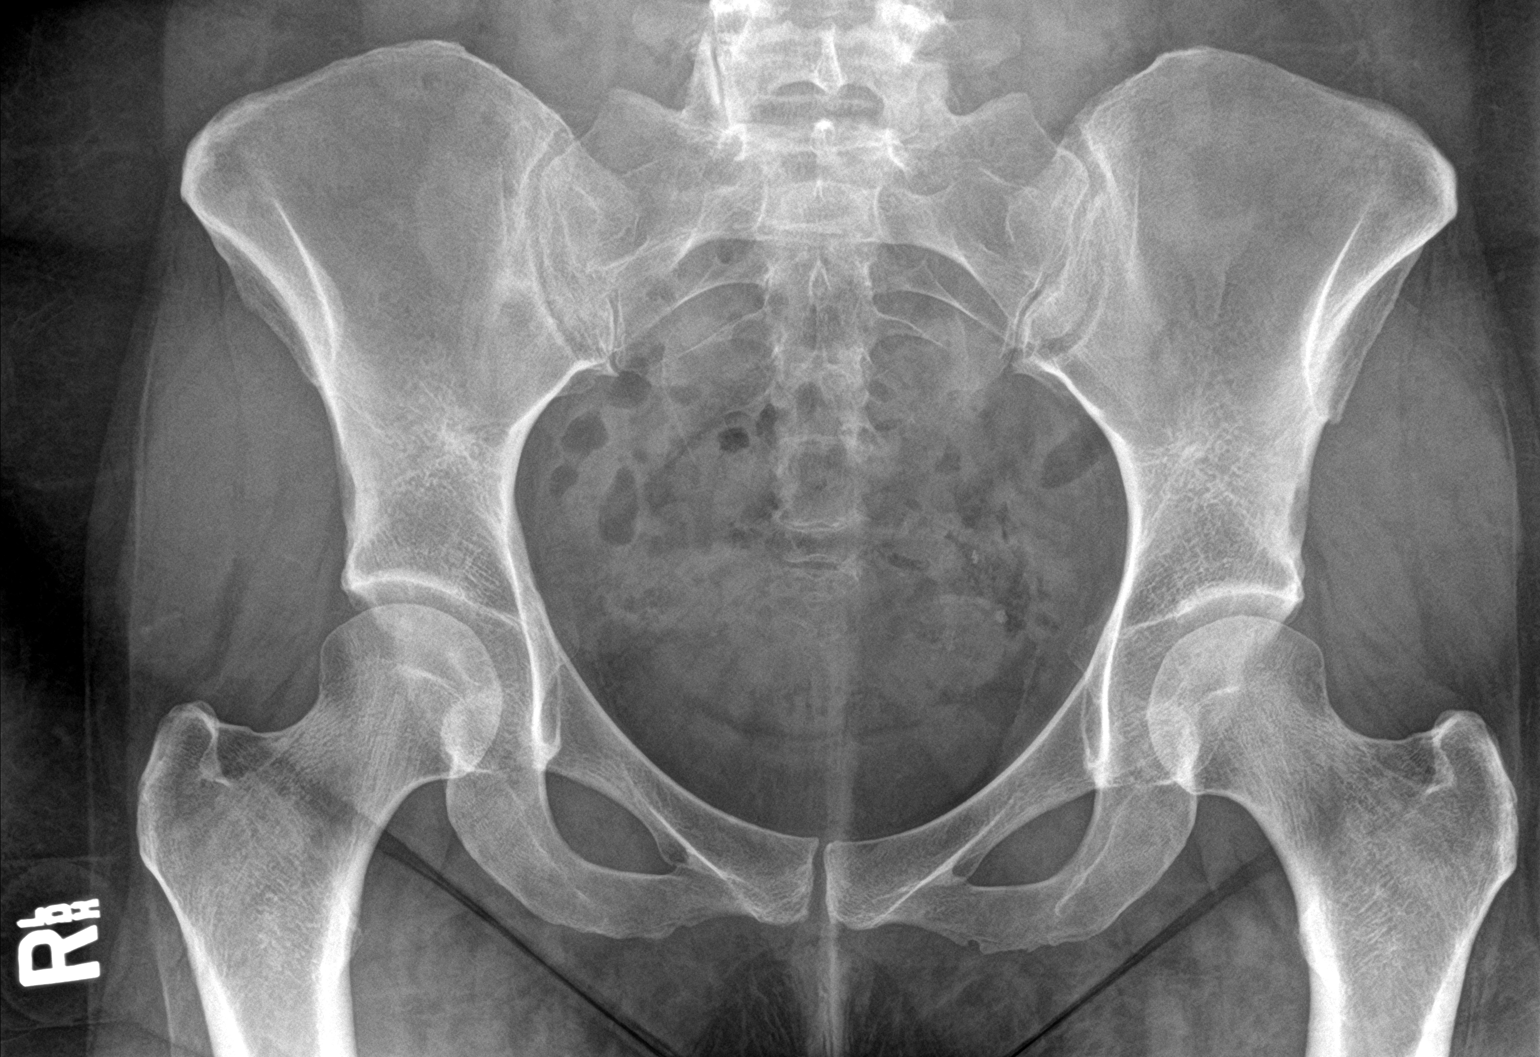
[im 2/3]
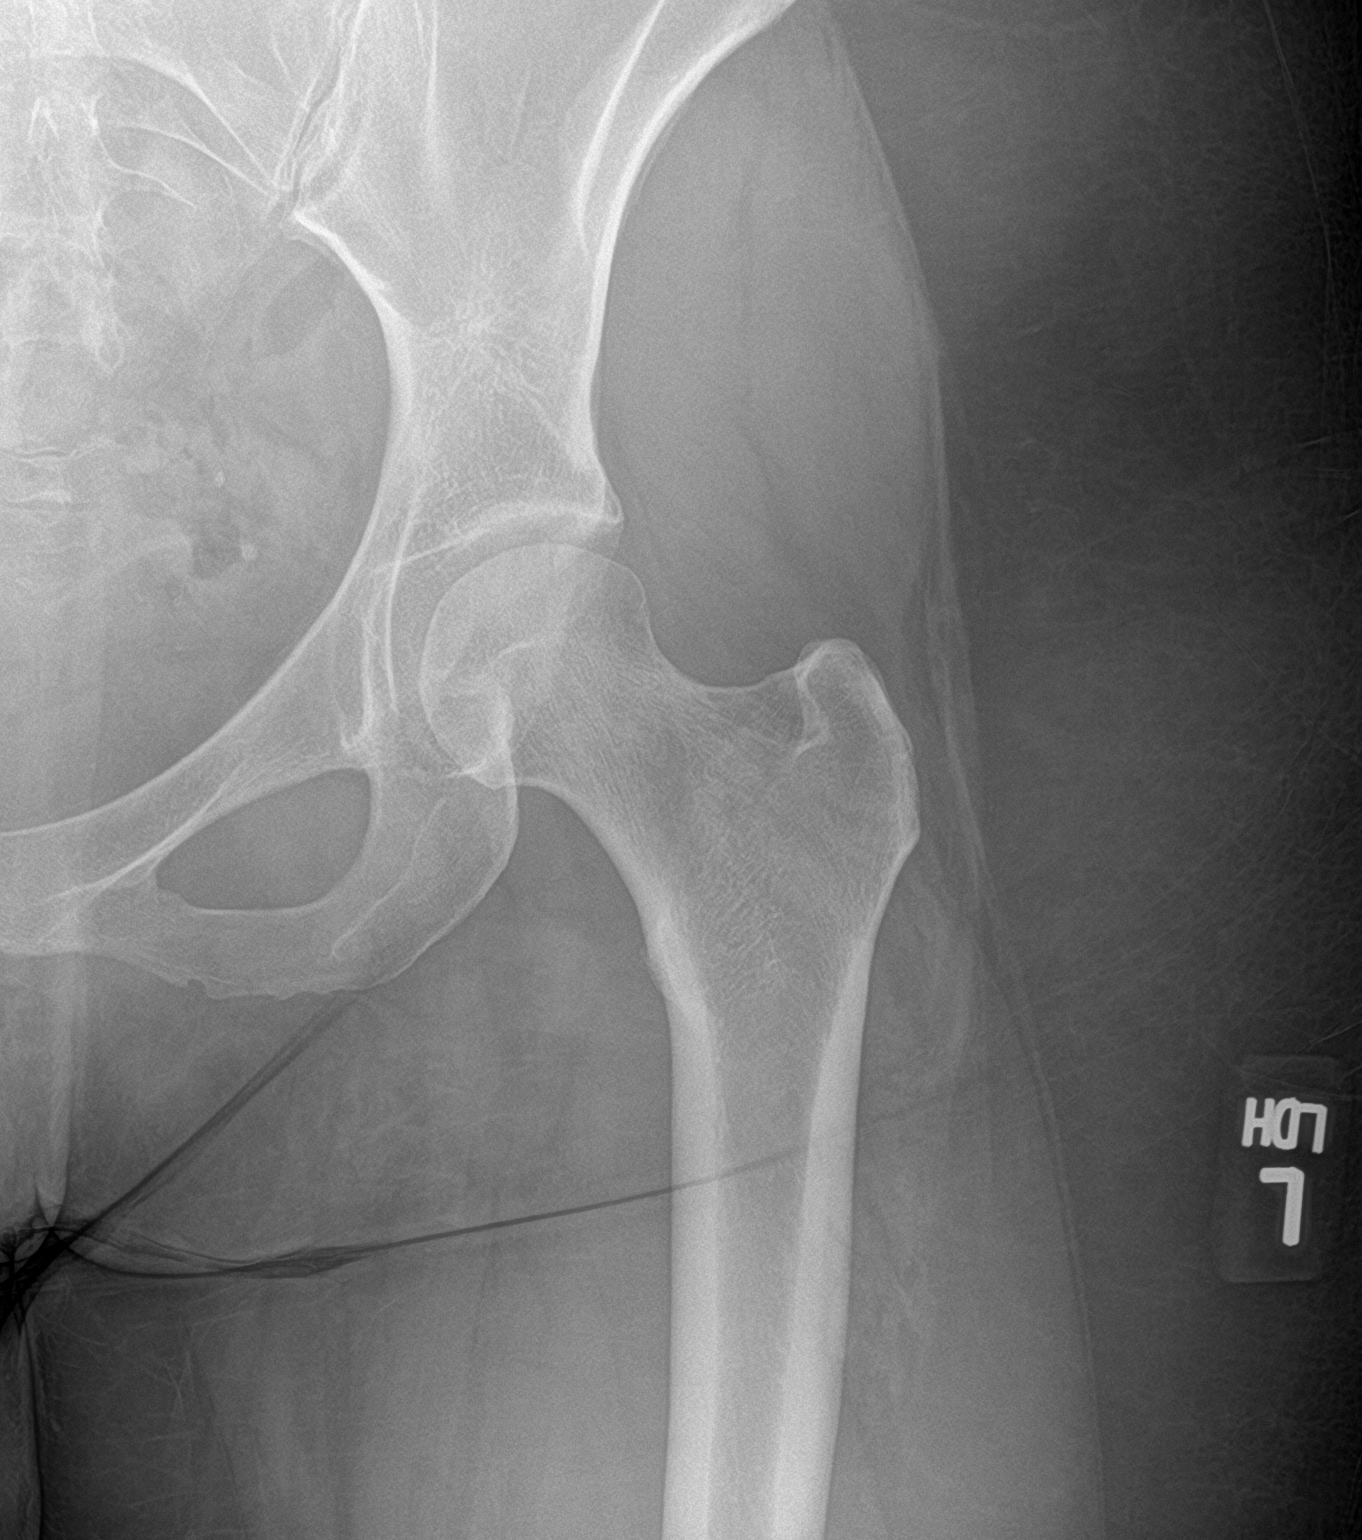
[im 3/3]
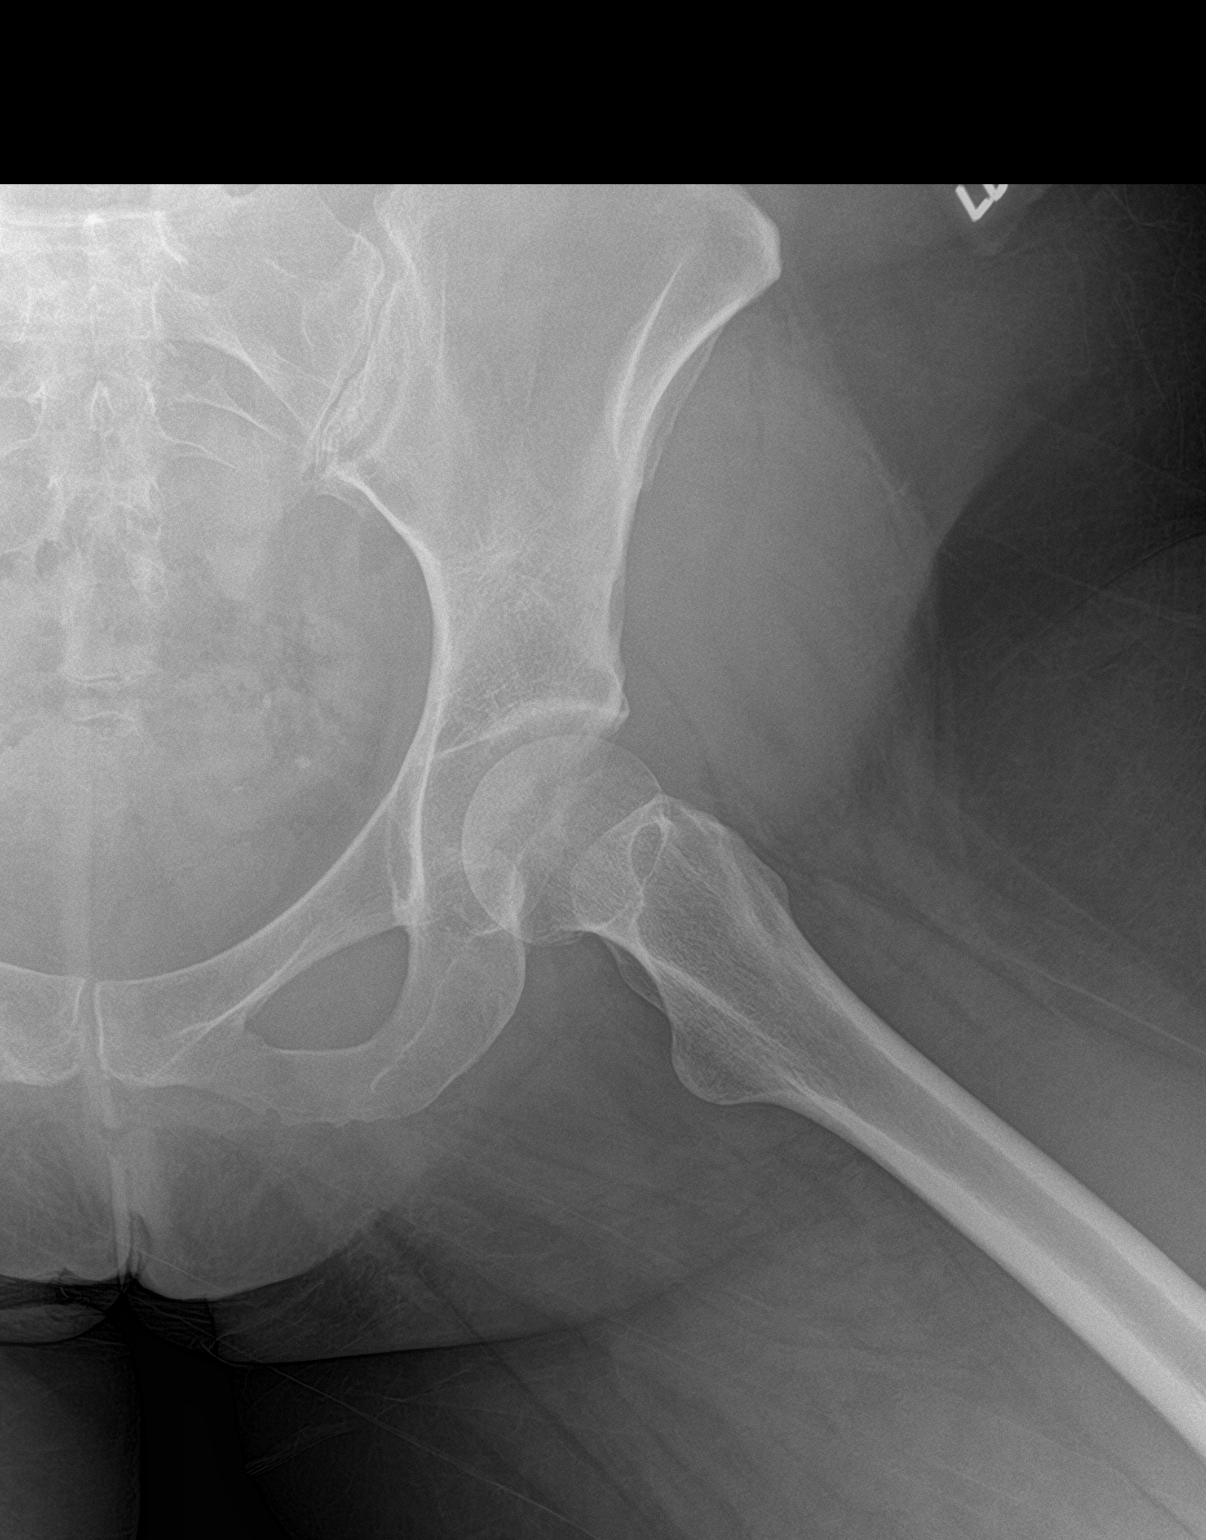

[3 of 3 positions shown; findings below may reference images not displayed]

FINDINGS: Preservation joint space. No evidence of femoral head avascular
necrosis. No fracture or dislocation.
IMPRESSION: Negative.

## 2019-05-07 IMAGING — CR DG LUMBAR SPINE COMPLETE W/ BEND
1 series · 7 of 7 positions shown · non-contrast
Comparison: None.

CLINICAL DATA: 27-year-old female with chronic pain both hips and
sacroiliac joints. Initial encounter.

EXAM:
LUMBAR SPINE - COMPLETE WITH BENDING VIEWS

[Series 1: dg lumbar spine complete w/bend 6+v · 0.14mm/px · 7 of 7 slices shown]
[im 1/7]
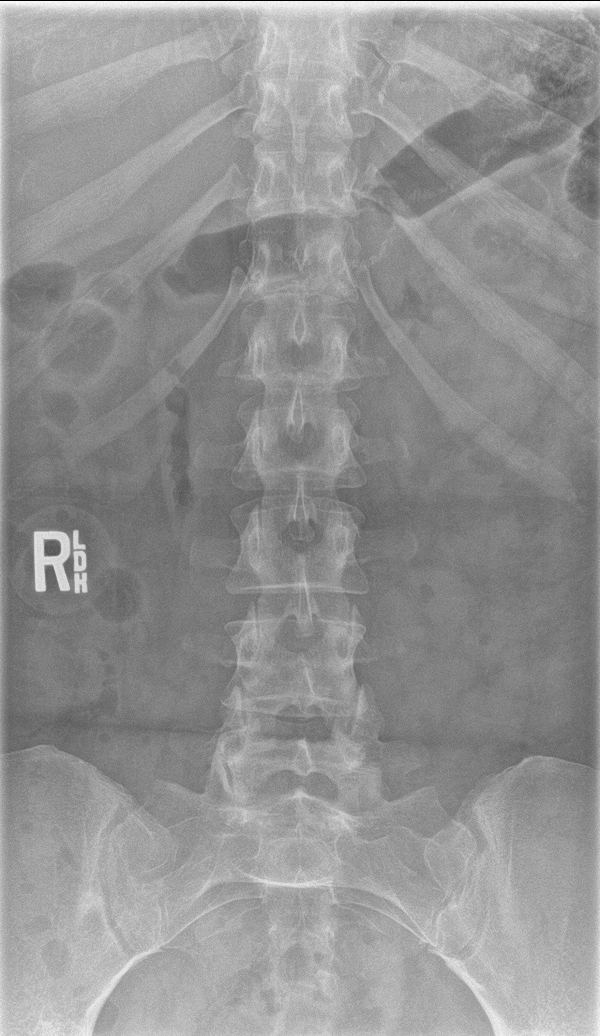
[im 2/7]
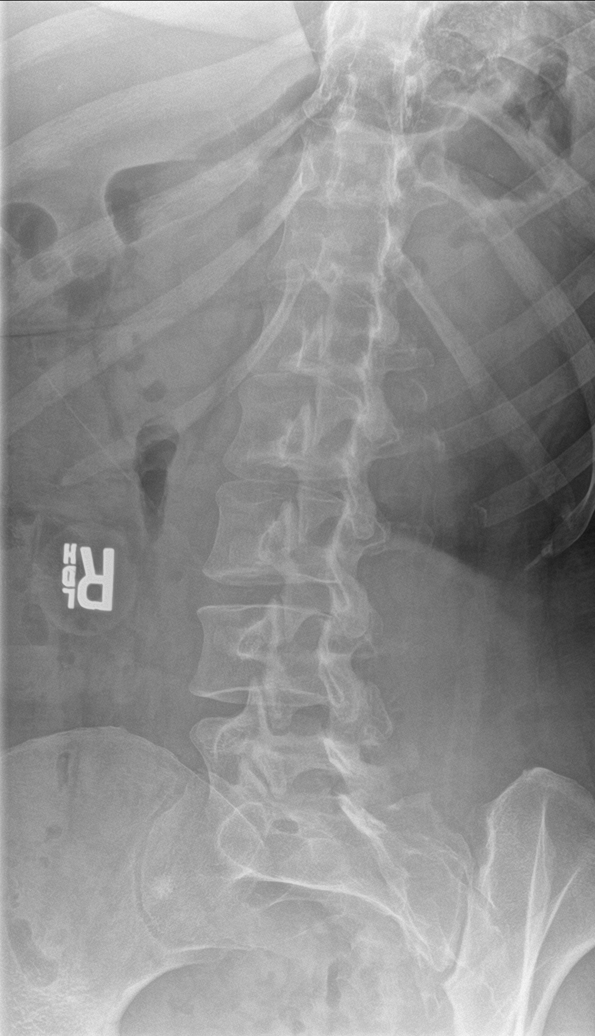
[im 3/7]
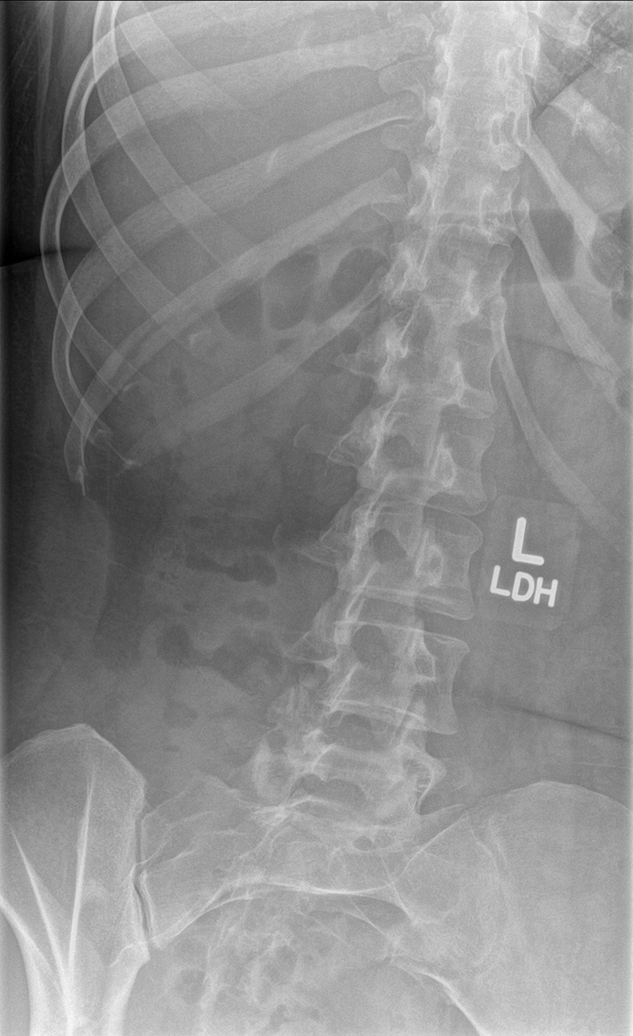
[im 4/7]
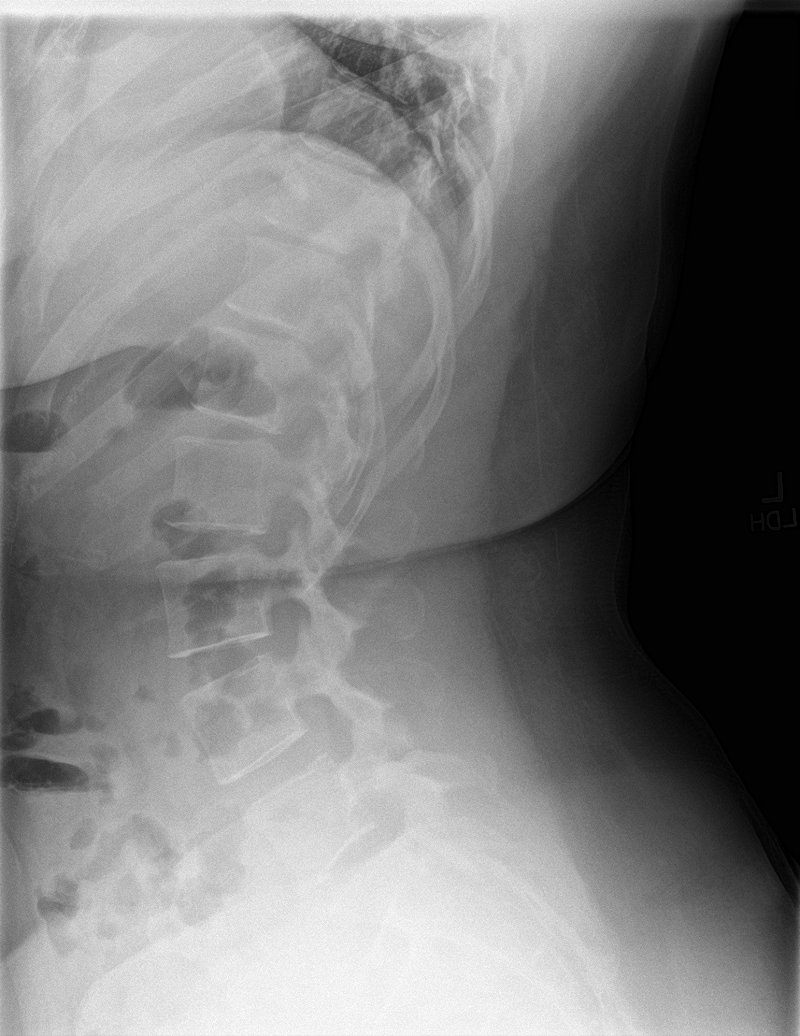
[im 5/7]
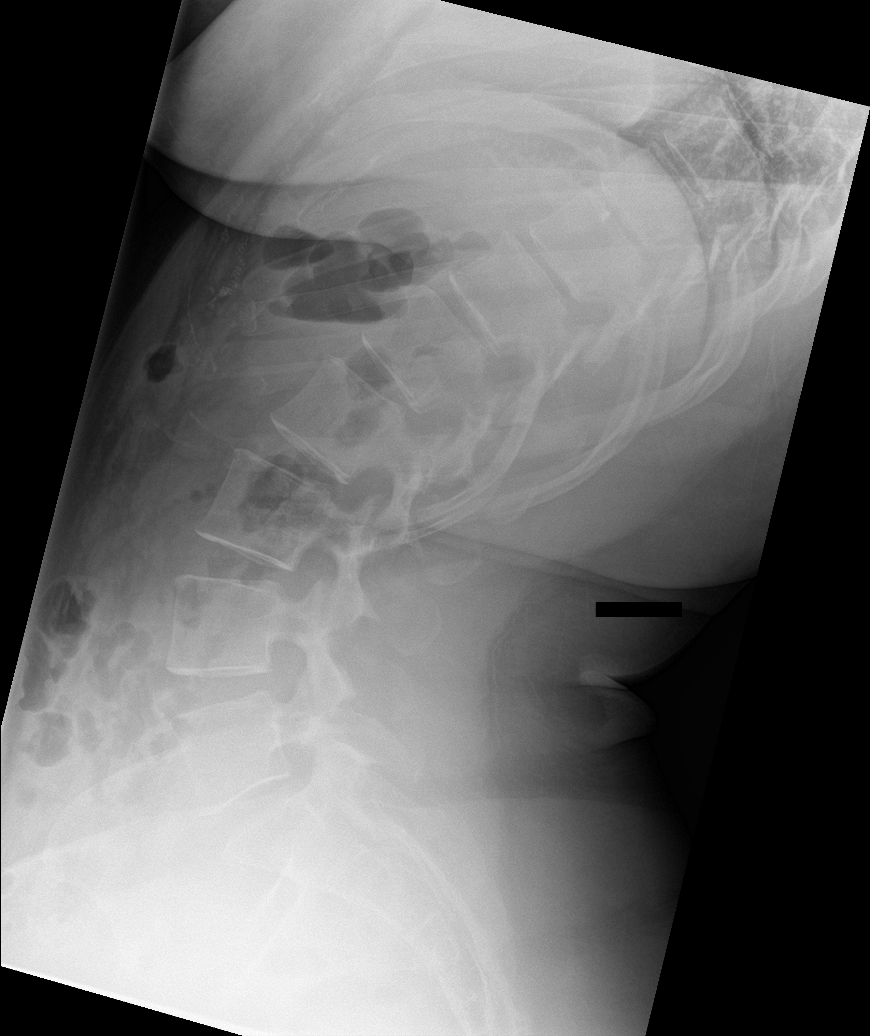
[im 6/7]
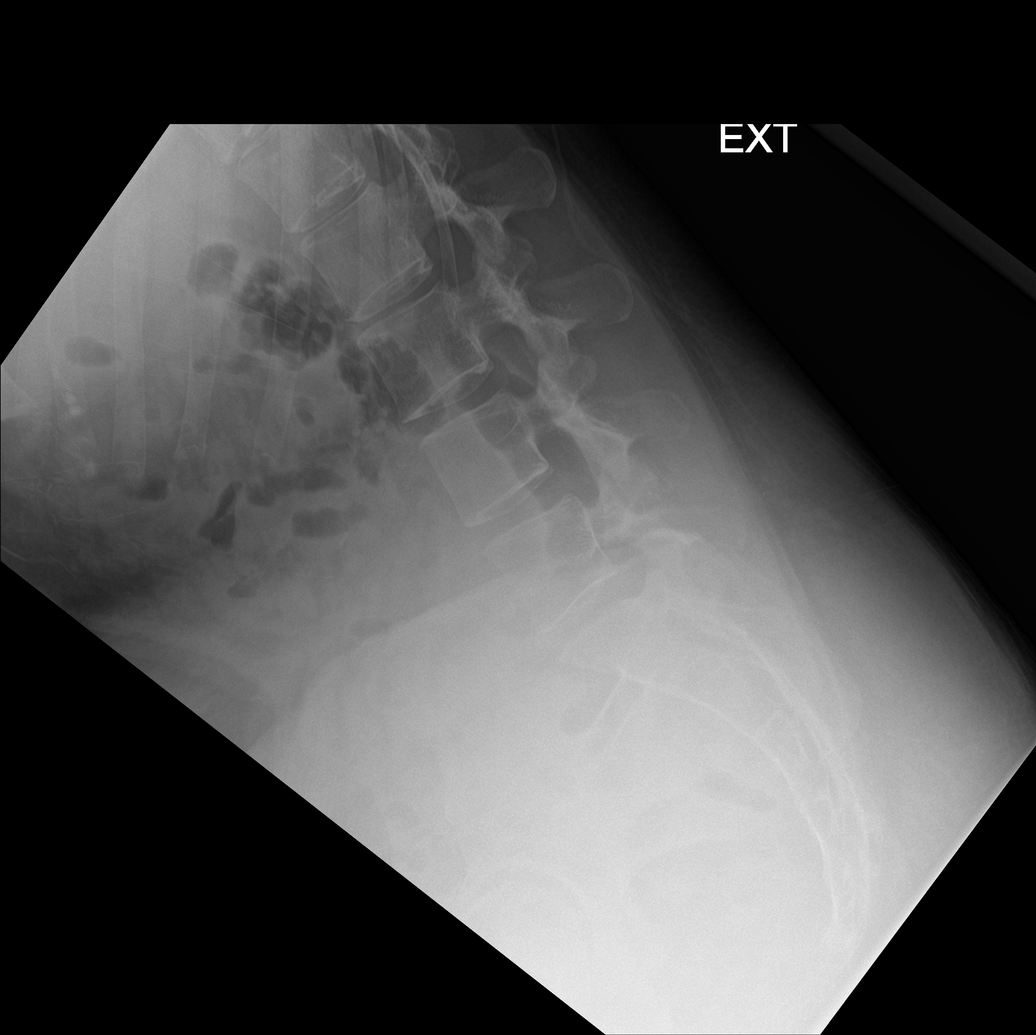
[im 7/7]
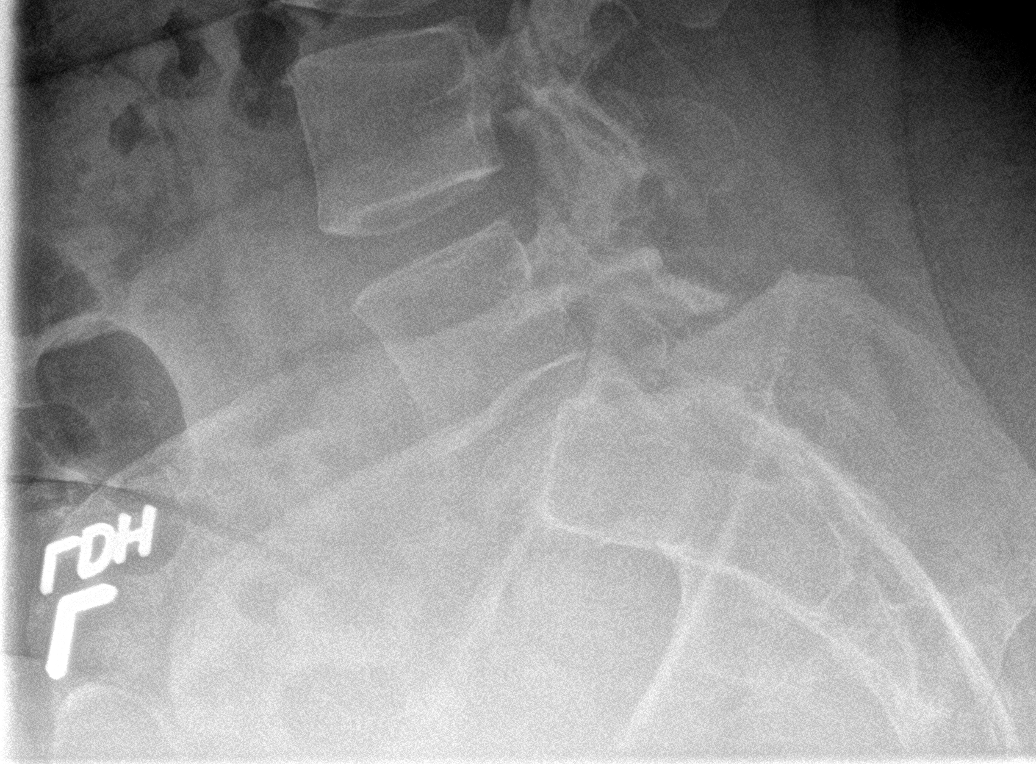

[7 of 7 positions shown; findings below may reference images not displayed]

FINDINGS: Normal alignment lumbar spine. No significant disc space narrowing.
No abnormal motion between flexion and extension. No compression
fracture or pars defect noted. No significant facet degenerative
changes. Small bone island right ilium. Sacroiliac joints appear
intact. Post surgical changes left upper quadrant.
IMPRESSION: Negative.
